# Patient Record
Sex: Male | Born: 1940 | Race: White | Hispanic: No | Marital: Married | Smoking: Former smoker
Health system: Southern US, Community
[De-identification: ages and names within clinical notes are randomized; demographics above are authoritative.]

## PROBLEM LIST (undated history)

## (undated) DIAGNOSIS — F0390 Unspecified dementia without behavioral disturbance: Secondary | ICD-10-CM

## (undated) DIAGNOSIS — K3184 Gastroparesis: Secondary | ICD-10-CM

## (undated) DIAGNOSIS — R6510 Systemic inflammatory response syndrome (SIRS) of non-infectious origin without acute organ dysfunction: Secondary | ICD-10-CM

## (undated) DIAGNOSIS — I471 Supraventricular tachycardia, unspecified: Secondary | ICD-10-CM

## (undated) DIAGNOSIS — K219 Gastro-esophageal reflux disease without esophagitis: Secondary | ICD-10-CM

## (undated) DIAGNOSIS — J8410 Pulmonary fibrosis, unspecified: Secondary | ICD-10-CM

## (undated) DIAGNOSIS — J449 Chronic obstructive pulmonary disease, unspecified: Secondary | ICD-10-CM

## (undated) DIAGNOSIS — R599 Enlarged lymph nodes, unspecified: Secondary | ICD-10-CM

## (undated) DIAGNOSIS — Z8701 Personal history of pneumonia (recurrent): Secondary | ICD-10-CM

## (undated) DIAGNOSIS — F419 Anxiety disorder, unspecified: Secondary | ICD-10-CM

## (undated) DIAGNOSIS — K602 Anal fissure, unspecified: Secondary | ICD-10-CM

## (undated) DIAGNOSIS — I714 Abdominal aortic aneurysm, without rupture: Secondary | ICD-10-CM

## (undated) DIAGNOSIS — Z720 Tobacco use: Secondary | ICD-10-CM

## (undated) DIAGNOSIS — Z860100 Personal history of colon polyps, unspecified: Secondary | ICD-10-CM

## (undated) DIAGNOSIS — R569 Unspecified convulsions: Secondary | ICD-10-CM

## (undated) DIAGNOSIS — K649 Unspecified hemorrhoids: Secondary | ICD-10-CM

## (undated) DIAGNOSIS — I2510 Atherosclerotic heart disease of native coronary artery without angina pectoris: Secondary | ICD-10-CM

## (undated) DIAGNOSIS — K5730 Diverticulosis of large intestine without perforation or abscess without bleeding: Secondary | ICD-10-CM

## (undated) DIAGNOSIS — K449 Diaphragmatic hernia without obstruction or gangrene: Secondary | ICD-10-CM

## (undated) DIAGNOSIS — I499 Cardiac arrhythmia, unspecified: Secondary | ICD-10-CM

## (undated) DIAGNOSIS — K279 Peptic ulcer, site unspecified, unspecified as acute or chronic, without hemorrhage or perforation: Secondary | ICD-10-CM

## (undated) DIAGNOSIS — K611 Rectal abscess: Secondary | ICD-10-CM

## (undated) DIAGNOSIS — R739 Hyperglycemia, unspecified: Secondary | ICD-10-CM

## (undated) DIAGNOSIS — E785 Hyperlipidemia, unspecified: Secondary | ICD-10-CM

## (undated) HISTORY — DX: Atherosclerotic heart disease of native coronary artery without angina pectoris: I25.10

## (undated) HISTORY — DX: Anal fissure, unspecified: K60.2

## (undated) HISTORY — DX: Diaphragmatic hernia without obstruction or gangrene: K44.9

## (undated) HISTORY — DX: Rectal abscess: K61.1

## (undated) HISTORY — DX: Cardiac arrhythmia, unspecified: I49.9

## (undated) HISTORY — DX: Supraventricular tachycardia, unspecified: I47.10

## (undated) HISTORY — DX: Gastro-esophageal reflux disease without esophagitis: K21.9

## (undated) HISTORY — DX: Personal history of colonic polyps: Z86.010

## (undated) HISTORY — DX: Unspecified convulsions: R56.9

## (undated) HISTORY — DX: Supraventricular tachycardia: I47.1

## (undated) HISTORY — DX: Diverticulosis of large intestine without perforation or abscess without bleeding: K57.30

## (undated) HISTORY — PX: TONSILLECTOMY: SUR1361

## (undated) HISTORY — DX: Personal history of pneumonia (recurrent): Z87.01

## (undated) HISTORY — DX: Hyperglycemia, unspecified: R73.9

## (undated) HISTORY — DX: Anxiety disorder, unspecified: F41.9

## (undated) HISTORY — DX: Tobacco use: Z72.0

## (undated) HISTORY — PX: APPENDECTOMY: SHX54

## (undated) HISTORY — DX: Personal history of colon polyps, unspecified: Z86.0100

## (undated) HISTORY — DX: Pulmonary fibrosis, unspecified: J84.10

## (undated) HISTORY — DX: Abdominal aortic aneurysm, without rupture: I71.4

## (undated) HISTORY — DX: Chronic obstructive pulmonary disease, unspecified: J44.9

## (undated) HISTORY — DX: Peptic ulcer, site unspecified, unspecified as acute or chronic, without hemorrhage or perforation: K27.9

## (undated) HISTORY — DX: Gastroparesis: K31.84

## (undated) HISTORY — DX: Hyperlipidemia, unspecified: E78.5

## (undated) HISTORY — DX: Unspecified hemorrhoids: K64.9

## (undated) HISTORY — DX: Enlarged lymph nodes, unspecified: R59.9

---

## 1997-03-24 HISTORY — PX: ABDOMINAL SURGERY: SHX537

## 1997-10-04 ENCOUNTER — Inpatient Hospital Stay (HOSPITAL_COMMUNITY): Admission: EM | Admit: 1997-10-04 | Discharge: 1997-10-13 | Payer: Self-pay | Admitting: Infectious Diseases

## 1999-01-14 ENCOUNTER — Other Ambulatory Visit: Admission: RE | Admit: 1999-01-14 | Discharge: 1999-01-14 | Payer: Self-pay | Admitting: Gastroenterology

## 1999-01-14 ENCOUNTER — Encounter (INDEPENDENT_AMBULATORY_CARE_PROVIDER_SITE_OTHER): Payer: Self-pay

## 1999-09-09 ENCOUNTER — Emergency Department (HOSPITAL_COMMUNITY): Admission: EM | Admit: 1999-09-09 | Discharge: 1999-09-09 | Payer: Self-pay | Admitting: Emergency Medicine

## 1999-11-07 ENCOUNTER — Inpatient Hospital Stay (HOSPITAL_COMMUNITY): Admission: EM | Admit: 1999-11-07 | Discharge: 1999-11-11 | Payer: Self-pay | Admitting: *Deleted

## 2002-12-15 ENCOUNTER — Encounter: Payer: Self-pay | Admitting: Family Medicine

## 2002-12-15 ENCOUNTER — Ambulatory Visit (HOSPITAL_COMMUNITY): Admission: RE | Admit: 2002-12-15 | Discharge: 2002-12-15 | Payer: Self-pay | Admitting: Family Medicine

## 2003-01-02 ENCOUNTER — Observation Stay (HOSPITAL_COMMUNITY): Admission: EM | Admit: 2003-01-02 | Discharge: 2003-01-03 | Payer: Self-pay | Admitting: Emergency Medicine

## 2003-01-02 ENCOUNTER — Encounter: Payer: Self-pay | Admitting: Emergency Medicine

## 2003-01-23 ENCOUNTER — Ambulatory Visit (HOSPITAL_COMMUNITY): Admission: RE | Admit: 2003-01-23 | Discharge: 2003-01-23 | Payer: Self-pay | Admitting: Family Medicine

## 2004-03-04 ENCOUNTER — Ambulatory Visit: Payer: Self-pay | Admitting: Family Medicine

## 2004-03-22 ENCOUNTER — Encounter: Admission: RE | Admit: 2004-03-22 | Discharge: 2004-03-22 | Payer: Self-pay | Admitting: Family Medicine

## 2004-04-22 ENCOUNTER — Ambulatory Visit: Payer: Self-pay | Admitting: Family Medicine

## 2004-09-09 ENCOUNTER — Ambulatory Visit: Payer: Self-pay | Admitting: Internal Medicine

## 2004-09-20 ENCOUNTER — Encounter: Admission: RE | Admit: 2004-09-20 | Discharge: 2004-09-20 | Payer: Self-pay | Admitting: Internal Medicine

## 2005-03-24 DIAGNOSIS — I714 Abdominal aortic aneurysm, without rupture, unspecified: Secondary | ICD-10-CM

## 2005-03-24 HISTORY — DX: Abdominal aortic aneurysm, without rupture, unspecified: I71.40

## 2005-03-24 HISTORY — DX: Abdominal aortic aneurysm, without rupture: I71.4

## 2005-04-06 ENCOUNTER — Emergency Department (HOSPITAL_COMMUNITY): Admission: EM | Admit: 2005-04-06 | Discharge: 2005-04-06 | Payer: Self-pay | Admitting: Family Medicine

## 2005-05-15 ENCOUNTER — Ambulatory Visit: Payer: Self-pay | Admitting: Internal Medicine

## 2005-09-04 ENCOUNTER — Ambulatory Visit: Payer: Self-pay | Admitting: Internal Medicine

## 2005-10-07 ENCOUNTER — Ambulatory Visit: Payer: Self-pay | Admitting: Internal Medicine

## 2005-10-17 ENCOUNTER — Ambulatory Visit: Payer: Self-pay | Admitting: Internal Medicine

## 2005-11-19 ENCOUNTER — Ambulatory Visit: Payer: Self-pay | Admitting: Internal Medicine

## 2006-05-18 ENCOUNTER — Ambulatory Visit: Payer: Self-pay | Admitting: Internal Medicine

## 2006-11-03 ENCOUNTER — Encounter (INDEPENDENT_AMBULATORY_CARE_PROVIDER_SITE_OTHER): Payer: Self-pay | Admitting: *Deleted

## 2007-01-01 ENCOUNTER — Telehealth (INDEPENDENT_AMBULATORY_CARE_PROVIDER_SITE_OTHER): Payer: Self-pay | Admitting: *Deleted

## 2007-02-25 ENCOUNTER — Ambulatory Visit: Payer: Self-pay | Admitting: Internal Medicine

## 2007-02-25 DIAGNOSIS — Z8711 Personal history of peptic ulcer disease: Secondary | ICD-10-CM

## 2007-02-25 DIAGNOSIS — F329 Major depressive disorder, single episode, unspecified: Secondary | ICD-10-CM

## 2007-02-25 DIAGNOSIS — I714 Abdominal aortic aneurysm, without rupture, unspecified: Secondary | ICD-10-CM | POA: Insufficient documentation

## 2007-02-25 DIAGNOSIS — Z8679 Personal history of other diseases of the circulatory system: Secondary | ICD-10-CM

## 2007-02-25 DIAGNOSIS — M949 Disorder of cartilage, unspecified: Secondary | ICD-10-CM

## 2007-02-25 DIAGNOSIS — K219 Gastro-esophageal reflux disease without esophagitis: Secondary | ICD-10-CM | POA: Insufficient documentation

## 2007-02-25 DIAGNOSIS — M899 Disorder of bone, unspecified: Secondary | ICD-10-CM | POA: Insufficient documentation

## 2007-02-25 DIAGNOSIS — E785 Hyperlipidemia, unspecified: Secondary | ICD-10-CM

## 2007-02-25 DIAGNOSIS — I2510 Atherosclerotic heart disease of native coronary artery without angina pectoris: Secondary | ICD-10-CM | POA: Insufficient documentation

## 2007-02-25 DIAGNOSIS — Z8719 Personal history of other diseases of the digestive system: Secondary | ICD-10-CM | POA: Insufficient documentation

## 2007-02-25 DIAGNOSIS — R7301 Impaired fasting glucose: Secondary | ICD-10-CM

## 2007-05-06 ENCOUNTER — Encounter: Payer: Self-pay | Admitting: Internal Medicine

## 2007-05-06 ENCOUNTER — Ambulatory Visit: Payer: Self-pay

## 2007-05-09 ENCOUNTER — Emergency Department (HOSPITAL_COMMUNITY): Admission: EM | Admit: 2007-05-09 | Discharge: 2007-05-09 | Payer: Self-pay | Admitting: Family Medicine

## 2007-05-10 ENCOUNTER — Telehealth (INDEPENDENT_AMBULATORY_CARE_PROVIDER_SITE_OTHER): Payer: Self-pay | Admitting: *Deleted

## 2007-05-11 ENCOUNTER — Ambulatory Visit: Payer: Self-pay | Admitting: Internal Medicine

## 2007-05-11 ENCOUNTER — Emergency Department (HOSPITAL_COMMUNITY): Admission: EM | Admit: 2007-05-11 | Discharge: 2007-05-11 | Payer: Self-pay | Admitting: Emergency Medicine

## 2007-05-11 ENCOUNTER — Ambulatory Visit: Payer: Self-pay | Admitting: Cardiology

## 2007-05-12 ENCOUNTER — Encounter: Payer: Self-pay | Admitting: Internal Medicine

## 2007-05-17 ENCOUNTER — Telehealth: Payer: Self-pay | Admitting: Internal Medicine

## 2007-06-18 ENCOUNTER — Encounter: Payer: Self-pay | Admitting: Internal Medicine

## 2008-02-07 ENCOUNTER — Encounter: Payer: Self-pay | Admitting: Internal Medicine

## 2008-04-24 ENCOUNTER — Telehealth: Payer: Self-pay | Admitting: Internal Medicine

## 2008-05-12 ENCOUNTER — Encounter: Payer: Self-pay | Admitting: Internal Medicine

## 2008-05-12 ENCOUNTER — Ambulatory Visit: Payer: Self-pay

## 2008-05-16 ENCOUNTER — Ambulatory Visit: Payer: Self-pay | Admitting: Family Medicine

## 2008-05-16 LAB — CONVERTED CEMR LAB
Basophils Absolute: 0.1 10*3/uL (ref 0.0–0.1)
Basophils Relative: 1 % (ref 0.0–3.0)
Eosinophils Absolute: 0.3 10*3/uL (ref 0.0–0.7)
Lymphocytes Relative: 25.4 % (ref 12.0–46.0)
Monocytes Absolute: 0.3 10*3/uL (ref 0.1–1.0)
Monocytes Relative: 3.6 % (ref 3.0–12.0)
Platelets: 210 10*3/uL (ref 150–400)
RBC: 5.45 M/uL (ref 4.22–5.81)
WBC: 9.2 10*3/uL (ref 4.5–10.5)

## 2008-05-23 ENCOUNTER — Telehealth (INDEPENDENT_AMBULATORY_CARE_PROVIDER_SITE_OTHER): Payer: Self-pay | Admitting: *Deleted

## 2008-05-25 ENCOUNTER — Ambulatory Visit: Payer: Self-pay | Admitting: Internal Medicine

## 2009-02-12 ENCOUNTER — Telehealth (INDEPENDENT_AMBULATORY_CARE_PROVIDER_SITE_OTHER): Payer: Self-pay | Admitting: *Deleted

## 2009-04-13 ENCOUNTER — Ambulatory Visit: Payer: Self-pay | Admitting: Internal Medicine

## 2009-04-13 ENCOUNTER — Telehealth (INDEPENDENT_AMBULATORY_CARE_PROVIDER_SITE_OTHER): Payer: Self-pay | Admitting: *Deleted

## 2009-04-13 DIAGNOSIS — R079 Chest pain, unspecified: Secondary | ICD-10-CM

## 2009-04-18 ENCOUNTER — Encounter (INDEPENDENT_AMBULATORY_CARE_PROVIDER_SITE_OTHER): Payer: Self-pay | Admitting: *Deleted

## 2009-04-18 ENCOUNTER — Telehealth (INDEPENDENT_AMBULATORY_CARE_PROVIDER_SITE_OTHER): Payer: Self-pay | Admitting: *Deleted

## 2009-04-19 ENCOUNTER — Encounter: Payer: Self-pay | Admitting: Internal Medicine

## 2009-05-18 ENCOUNTER — Telehealth (INDEPENDENT_AMBULATORY_CARE_PROVIDER_SITE_OTHER): Payer: Self-pay | Admitting: *Deleted

## 2009-06-18 ENCOUNTER — Telehealth (INDEPENDENT_AMBULATORY_CARE_PROVIDER_SITE_OTHER): Payer: Self-pay | Admitting: *Deleted

## 2009-07-18 LAB — CONVERTED CEMR LAB
BUN: 10 mg/dL
Chloride, Serum: 101 mmol/L
Creatinine, Ser: 0.8 mg/dL
Glucose, Bld: 109 mg/dL
Potassium, serum: 3.9 mmol/L
RBC count: 5.1 10*6/uL
TSH: 2.12 microintl units/mL
Total Bilirubin: 0.8 mg/dL
Triglyceride fasting, serum: 70 mg/dL
WBC, blood: 8.9 10*3/uL

## 2009-07-30 ENCOUNTER — Ambulatory Visit: Payer: Self-pay | Admitting: Internal Medicine

## 2009-08-03 ENCOUNTER — Telehealth (INDEPENDENT_AMBULATORY_CARE_PROVIDER_SITE_OTHER): Payer: Self-pay | Admitting: *Deleted

## 2010-04-21 LAB — CONVERTED CEMR LAB
AST: 30 units/L (ref 0–37)
AST: 32 units/L (ref 0–37)
BUN: 8 mg/dL (ref 6–23)
Basophils Absolute: 0 10*3/uL (ref 0.0–0.1)
CO2: 28 meq/L (ref 19–32)
Calcium: 9.3 mg/dL (ref 8.4–10.5)
Calcium: 9.3 mg/dL (ref 8.4–10.5)
Chloride: 106 meq/L (ref 96–112)
Creatinine, Ser: 0.7 mg/dL (ref 0.4–1.5)
Eosinophils Absolute: 0.2 10*3/uL (ref 0.0–0.6)
Eosinophils Absolute: 0.3 10*3/uL (ref 0.0–0.7)
Eosinophils Relative: 2.6 % (ref 0.0–5.0)
Eosinophils Relative: 3.2 % (ref 0.0–5.0)
GFR calc non Af Amer: 101.83 mL/min (ref >60)
HDL: 52.6 mg/dL (ref >39.0)
HDL: 64.7 mg/dL (ref >39.00)
Hemoglobin: 15.8 g/dL (ref 13.0–17.0)
Hemoglobin: 17.1 g/dL — ABNORMAL HIGH (ref 13.0–17.0)
Hgb A1c MFr Bld: 5.8 % (ref 4.6–6.0)
Hgb A1c MFr Bld: 5.9 % (ref 4.6–6.5)
Iron: 153 ug/dL (ref 42–165)
Lymphocytes Relative: 19.1 % (ref 12.0–46.0)
Lymphocytes Relative: 23.4 % (ref 12.0–46.0)
Lymphs Abs: 1.7 10*3/uL (ref 0.7–4.0)
Monocytes Absolute: 0.5 10*3/uL (ref 0.2–0.7)
Monocytes Absolute: 0.7 10*3/uL (ref 0.1–1.0)
Monocytes Relative: 7.4 % (ref 3.0–12.0)
Neutro Abs: 5 10*3/uL (ref 1.4–7.7)
PSA: 1.86 ng/mL (ref 0.10–4.00)
Platelets: 199 10*3/uL (ref 150.0–400.0)
RBC: 4.92 M/uL (ref 4.22–5.81)
RDW: 13.3 % (ref 11.5–14.6)
Total CHOL/HDL Ratio: 3
Total CHOL/HDL Ratio: 3.1
Triglycerides: 94 mg/dL (ref 0–149)
VLDL: 19 mg/dL (ref 0–40)
WBC: 7.4 10*3/uL (ref 4.5–10.5)

## 2010-04-25 NOTE — Progress Notes (Signed)
  Phone Note Refill Request Message from:  Pharmacy on [facility] on [address]. Fax #: [phone]  Refills Requested: Medication #1:  VERAPAMIL HCL CR 180 MG CP24 1 by mouth qd   Dosage confirmed as above?Dosage Confirmed   Supply Requested: 1 month The 180 ER capsules are on long term backorder.Marland KitchenMarland KitchenCan we switch this patient to the 180 ER TABLETS??? Thank you.  Initial call taken by: Harold Barban,  May 18, 2009 2:08 PM  Follow-up for Phone Call        please clarify above [place] E. Paz MD  May 18, 2009 5:09 PM   -Spoke with pharmacist  who says they do have capsules in and rx is ready.  disregard;  rx is in stock Kandice Hams  May 21, 2009 11:04 AM  Follow-up by: Kandice Hams,  May 21, 2009 11:04 AM

## 2010-04-25 NOTE — Progress Notes (Signed)
Summary: Phone//Pantoprazole too expensive//Paz see   Phone Note Call from Patient Call back at Home Phone [phone]   Caller: Patient Summary of Call: patient is requesting a call back about medications he stated the med for his stomach is too expensive. Patient would like something else prescribe. Patient also has bloodwork today and would like to have his psa checked with the bloodwork he had today. Initial call taken by: Barb Merino,  April 13, 2009 10:55 AM  Follow-up for Phone Call        Pt says med Pantoprazole too expensive cost him $299 for 30 pills wants to get something else.pt also informed PSA not able to be done today (per Rene Kocher), will do PSA at [facility]. Kandice Hams  April 13, 2009 11:40 AM  Follow-up by: Kandice Hams,  April 13, 2009 11:40 AM  Additional Follow-up for Phone Call Additional follow up Details #1::        omeprazole 40mg  1 by mouth two times a day #60, no RF if unable to get insurance to cover , he may just buy OTC omeprazole Jose E. Paz MD  April 13, 2009 1:23 PM     Additional Follow-up for Phone Call Additional follow up Details #2::    Rx changed, pt informed .Kandice Hams  April 13, 2009 2:08 PM  Follow-up by: Kandice Hams,  April 13, 2009 2:08 PM  New/Updated Medications: OMEPRAZOLE 40 MG CPDR (OMEPRAZOLE) 1 by mouth two times a day Prescriptions: OMEPRAZOLE 40 MG CPDR (OMEPRAZOLE) 1 by mouth two times a day  #60 x 0   Entered by:   Kandice Hams   Authorized by:   Nolon Rod. Paz MD   Signed by:   Kandice Hams on 04/13/2009   Method used:   Faxed to ...       [facility] [address] [phone]* (retail)       [address]       [place], [place]  [address]       Ph: [phone]       Fax: [phone]   RxID:   [id number]

## 2010-04-25 NOTE — Assessment & Plan Note (Signed)
Summary: yearly  /kdc   Vital Signs:  Patient profile:   70 year old male Height:      70.5 inches Weight:      193.4 pounds BMI:     27.46 Pulse rate:   86 / minute BP sitting:   122 / 74  Vitals Entered By: Shary Decamp (Jul 30, 2009 1:57 PM) CC: yearly, fasting   History of Present Illness: was seen at the [place] for a check up few days ago: brought records from the [place] for my review: 07/18/09 WBC 8.9, hemoglobin 15.6, platelets 213, urinalysis negative potassium 3.9, creatinine 0.8, calcium 8.9, LFTs normal LDL 74, HDL 69, total cholesterol 478, triglycerides 70  Anxiety-- started fluoxetine 9 days ago at the [place] , was seen by psych   Coronary artery disease had a episode of SOB, to have a stress test at the [place] soon  GERD-- self d/c omeprazole d/t nausea   AAA--had to cancel d/t an acute ilness, plans to reschedule   yearly checkup, chart reviewed     Current Medications (verified): 1)  Verapamil Hcl Cr 180 Mg Cp24 (Verapamil Hcl) .Marland Kitchen.. 1 By Mouth Once Daily - Needs Physical For Additional Refills 2)  Bayer Low Strength 81 Mg  Tbec (Aspirin) 3)  Nitroquick 0.4 Mg  Subl (Nitroglycerin) .Marland Kitchen.. 1 Q 5 Min As Needed Chest Pain 4)  Fluoxetine Hcl 10 Mg Tabs (Fluoxetine Hcl) .... 2 By Mouth Qam 5)  Zocor 40 Mg Tabs (Simvastatin) .Marland Kitchen.. 1 By Mouth Once Daily  Allergies (verified): 1)  ! Codeine 2)  ! Flagyl  Past History:  Past Medical History: Reviewed history from 04/13/2009 and no changes required. Anxiety Coronary artery disease per cath, no intervention needed  Diverticulitis, hx of GERD SVT AAA glucose impaired Hyperlipidemia  Past Surgical History: Reviewed history from 04/13/2009 and no changes required. 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 Cath 2004 for CP:  "The patient has total occlusion of the  dominant circumflex which is stable compared with August 2001.  There is no change in his coronary anatomy to explain the change in his symptoms.  Will add nitrates for control of his angina"  Family History: Prostate Ca - DAD colon ca--no DM - M MI-- no  Social History: Married 4 children tobacco--  > 1PPD, to get counseling at the [place] soon  ETOH-- "almost daily, working on cutting down"-- to get counseling at the [place] soon still very active   Review of Systems CV:  Denies chest pain or discomfort and swelling of feet. Resp:  Denies coughing up blood and wheezing; occasionally cough, in AM , yellow sputum . GI:  Denies bloody stools, diarrhea, nausea, and vomiting. GU:  Denies dysuria and hematuria.  Physical Exam  General:  alert, well-developed, and well-nourished.   Neck:  normal carotid upstroke.   Lungs:  normal respiratory effort, no intercostal retractions, no accessory muscle use, and normal breath sounds.   Heart:  normal rate and regular rhythm.   Abdomen:  soft, non-tender, no distention, and no masses.   Rectal:  done at the [place] few days ago, reportedly normal Extremities:  no pretibial edema bilaterally  Psych:  Oriented X3, memory intact for recent and remote, normally interactive, good eye contact, not anxious appearing, and not depressed appearing.     Impression & Recommendations:  Problem # 1:  HYPERLIPIDEMIA (  ICD-272.4) 4/27/11labs  LFTs normal LDL 74, HDL 69, total cholesterol 027, triglycerides 70 at goal , no change His updated medication list for this problem includes:    Zocor 40 Mg Tabs (Simvastatin) .Marland Kitchen... 1 by mouth once daily  Problem # 2:  HEALTH SCREENING (ICD-V70.0) Td 03  pneumonia shot:  1-11  recent PSA normal reports he will have a colonoscopy at the [place] soon he will have counseling at the [place] in reference to tobacco and alcohol abuse, per patient    Problem # 3:  IMPAIRED FASTING GLUCOSE (ICD-790.21) 07/18/09 CBG was 109 A1C 1-11  5.9 RTC 6 months   Problem # 4:  AAA (ICD-441.4)  was due for ultrasound to  2/11, states he will reschedule  Problem # 5:  CORONARY ARTERY DISEASE (ICD-414.00) due to a recent problem with shortness of breath, he will have a stress test at the [place]  His updated medication list for this problem includes:    Verapamil Hcl Cr 180 Mg Cp24 (Verapamil hcl) .Marland Kitchen... 1 by mouth once daily - needs physical for additional refills    Bayer Low Strength 81 Mg Tbec (Aspirin)    Nitroquick 0.4 Mg Subl (Nitroglycerin) .Marland Kitchen... 1 q 5 min as needed chest pain  Problem # 6:  ANXIETY (ICD-300.00) recently seen at the [place] w/  anxiety, no depression. he was also seen by psychiatry's, started fluoxetine, to have counseling soon  His updated medication list for this problem includes:    Fluoxetine Hcl 10 Mg Tabs (Fluoxetine hcl) .Marland Kitchen... 2 by mouth qam  Complete Medication List: 1)  Verapamil Hcl Cr 180 Mg Cp24 (Verapamil hcl) .Marland Kitchen.. 1 by mouth once daily - needs physical for additional refills 2)  Bayer Low Strength 81 Mg Tbec (Aspirin) 3)  Nitroquick 0.4 Mg Subl (Nitroglycerin) .Marland Kitchen.. 1 q 5 min as needed chest pain 4)  Fluoxetine Hcl 10 Mg Tabs (Fluoxetine hcl) .... 2 by mouth qam 5)  Zocor 40 Mg Tabs (Simvastatin) .Marland Kitchen.. 1 by mouth once daily  Patient Instructions: 1)  Please schedule a follow-up appointment in 6 months  Prescriptions: VERAPAMIL HCL CR 180 MG CP24 (VERAPAMIL HCL) 1 by mouth once daily - NEEDS PHYSICAL FOR ADDITIONAL REFILLS  #30 x 12   Entered and Authorized by:   Nolon Rod. Avo Schlachter MD   Signed by:   Nolon Rod. Paige Monarrez MD on 07/30/2009   Method used:   Print then Give to Patient   RxID:   [id number]    Preventive Care Screening  Prior Values:    PSA:  1.86 (04/13/2009)    Colonoscopy:  polyps - recheck in 3 years (02/18/2003)    Last Tetanus Booster:  historical (07/23/2001)    Last Pneumovax:  Pneumovax (Medicare) (04/13/2009)    Complete Blood Count Test Date: 07/18/2009             Value    Units      H/L    Reference  WBC:       8.9   X 10^3/uL          (3.5-10.0) RBC:       5.10  X 10^6/uL          (4.20-5.80) Hgb:       15.6  g/dl               (63.8-75.6) Hct:       48.0  %                  (  38.5-52.0) Platelets: 213   X 10^3/uL          (150-450)    Lab Entry Test Date: 07/18/2009                        Value        Units        H/L   Reference  PSA:                  1.37         ng/ml              (0-4.0) TSH:                  2.12         mIU/ml             (0.5-5.0)    Lipid Panel Test Date: 07/18/2009                        Value        Units        H/L   Reference  Cholesterol:          157          mg/dL              (045-409) LDL Cholesterol:      74           mg/dL              (81-191) HDL Cholesterol:      69           mg/dL              (47-82) Triglyceride:         70           mg/dL              (95-621) SGOT (AST)            16                              SGPT (ALT)            36                                 Chemistry Labs Test Date: 07/18/2009                      Value Units        H/L   Reference  Sodium:             138   mmol/L             (137-145) Potassium:          3.9   mmol/L             (3.6-5.0) Chloride:           101   mmol/L             (101-111) CO2:                29    mmol/L             (22-31) BUN:  10    mg/dL              (1-61) Creatinine:         0.8   mg/dL              (0.9-6.0) Glucose-random:     109   mg/dL              (45-409) Calcium (total):    8.9   mg/dL         L    (8-11.9) Alkaline P'tase:    73    U/L                (10-120) T. Bili:            0.8   mg/dL              (1.4-7.8) Albumin:            4.2   g/dL               (3-5) Total Protein:      8.0   g/dL          H    (4-7)

## 2010-04-25 NOTE — Assessment & Plan Note (Signed)
Summary: due ov/kdc   Vital Signs:  Patient profile:   70 year old male Weight:      195.25 pounds Pulse rate:   64 / minute BP sitting:   140 / 80  Vitals Entered By: Kandice Hams (April 13, 2009 8:26 AM) CC: med refill   History of Present Illness: last ROV  a while back, goes to the [place] sometimes  Chief complaint today is chest pain, sx going on for more than a year, getting more frequently in the last few months, now almost daily Pain is usually at rest, in the anterior  chest, without radiation. CP goes away "as soon as I  belch"  no associated nausea, diaphoresis has never tried nitroglycerin for that walks the dog  twice a day without chest pain  AAA--last ultrasound stable, February 2010.   Hyperlipidemia-- off zocor x aprox a year, [facility]"  history of colon polyps, due for colonoscopy  Allergies: 1)  ! Codeine 2)  ! 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 Cath 2004 for CP:  "The patient has total occlusion of the dominant circumflex which is stable compared with August 2001.  There is no change in his coronary anatomy to explain the change in his symptoms.  Will add nitrates for control of his angina"  Social History: Married 4 children tobacco--  > 1PPD ETOH-- "almost daily, working on cutting down"  Review of Systems General:  Denies fever. Resp:  Denies coughing up blood; chronic mild cough x years. GI:  occ has classic heartburn no dysphagia or odynophagia.  Physical Exam  General:  alert and well-developed.   Lungs:  Normal respiratory effort, chest  expands symmetrically. Lungs are clear to auscultation, no crackles or wheezes. Heart:  normal rate and regular rhythm.   Abdomen:  soft, non-tender, no distention, no masses, no guarding, and no rigidity.   Extremities:  no edema   Impression & Recommendations:  Problem # 1:  CHEST PAIN (ICD-786.50) Assessment New quite atypical chest pain, will start PPIs and reasses on RTC ER  if symptoms increase  Orders: TLB-ALT (SGPT) (84460-ALT) TLB-AST (SGOT) (84450-SGOT) TLB-CBC Platelet - w/Differential (85025-CBCD) TLB-Lipid Panel (80061-LIPID) EKG w/ Interpretation (93000)  Problem # 2:  GERD (ICD-530.81) history of GERD and PUD start PPIs  His updated medication list for this problem includes:    Omeprazole 40 Mg Cpdr (Omeprazole) .Marland Kitchen... 1 by mouth two times a day  Problem # 3:  HYPERLIPIDEMIA (ICD-272.4) off medication see HPI ---->  explained  that in his case he likely will need medication for life The following medications were removed from the medication list:    Zocor 40 Mg Tabs (Simvastatin) ..... 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  See past surgical history His updated medication list for this problem includes:    Verapamil Hcl Cr 180 Mg Cp24 (Verapamil hcl) .Marland Kitchen... 1 by mouth qd    Bayer Low Strength 81 Mg Tbec (Aspirin)    Nitroquick 0.4 Mg Subl (Nitroglycerin) .Marland Kitchen... 1 q 5 min as needed chest  pain  Orders: TLB-BMP (Basic Metabolic Panel-BMET) (80048-METABOL)  Problem # 7:  HEALTH SCREENING (ICD-V70.0)  due for a physical exam,  will come back in two weeks strongly recommended flu shot, explained benefits, declined  requested a pneumonia shot: done  counseling strongly against tobacco  Complete Medication List: 1)  Verapamil Hcl Cr 180 Mg Cp24 (Verapamil hcl) .Marland Kitchen.. 1 by mouth qd 2)  Bayer Low Strength 81 Mg Tbec (Aspirin) 3)  Nitroquick 0.4 Mg Subl (Nitroglycerin) .Marland Kitchen.. 1 q 5 min as needed chest pain 4)  Hydrocodone-acetaminophen 10-325 Mg/16ml Soln (Hydrocodone-acetaminophen) .Marland Kitchen.. 1 by mouth every 6 hours as needed 5)  Hydrocortisone Supp.  .... Use two times a day 6)  Buspirone Hcl 5 Mg Tabs (Buspirone hcl) .... 1/2 by mouth bid 7)  Omeprazole 40 Mg Cpdr (Omeprazole) .Marland Kitchen.. 1 by mouth two times a day  Other Orders: Venipuncture (04540) Pneumococcal Vaccine (98119) Admin 1st Vaccine (14782) Admin 1st Vaccine [facility]) [phone])  Patient Instructions: 1)  start Protonix twice a day 2)  go to the ER if  severe chest pain or last more than 5 minutes. take nitroglycerin 3)  come back in two weeks for a physical exam Prescriptions: NITROQUICK 0.4 MG  SUBL (NITROGLYCERIN) 1 q 5 min as needed Chest pain  #20 x 0   Entered and Authorized by:   Nolon Rod. Chyrel Taha MD   Signed by:   Nolon Rod. Aedon Deason MD on 04/13/2009   Method used:   Electronically to        [facility]. [phone]* (retail)       [address]       [place], [place]  [address]       Ph: [phone]       Fax: [phone]   RxID:   [id number] VERAPAMIL HCL CR 180 MG CP24 (VERAPAMIL HCL) 1 by mouth qd  #30.0 Each x 6   Entered and Authorized by:   Nolon Rod. Ayven Pheasant MD   Signed by:   Nolon Rod. Bonny Vanleeuwen MD on 04/13/2009   Method used:   Electronically to        [facility]. [phone]* (retail)       [address]       [place], [place]  [address]       Ph: [phone]       Fax: [phone]   RxID:    [id number] PANTOPRAZOLE SODIUM 40 MG TBEC (PANTOPRAZOLE SODIUM) one twice a day on an empty stomach  #60 x 3   Entered and Authorized by:   Nolon Rod. Seferino Oscar MD   Signed by:   Nolon Rod. Naileah Karg MD on 04/13/2009   Method used:   Electronically to        [facility]. [phone]* (retail)       [address]       [place], [place]  [address]       Ph: [phone]       Fax: [phone]   RxID:   [id number]    Pneumovax Vaccine    Vaccine Type: Pneumovax (Medicare)  Site: left deltoid    Dose: 0.5 ml    Route: IM    Given by: Shary Decamp    Exp. Date: 04/19/2010    Lot #: [id number]

## 2010-04-25 NOTE — Progress Notes (Signed)
   Release of Information Sent to Pt Via Mail today  Dennis Combs  Aug 03, 2009 10:44 AM    Appended Document:  Recieved ROIback from pt, copied records and mailed out to pt today

## 2010-04-25 NOTE — Letter (Signed)
Summary: [facility]   Imported By: Lanelle Bal 02/24/2008 12:24:46  _____________________________________________________________________  External Attachment:    Type:   Image     Comment:   External Document

## 2010-04-25 NOTE — Medication Information (Signed)
Summary: Prior Authorization & Approval for Omeprazole/[employer]  Prior Authorization & Approval for Omeprazole/[employer]   Imported By: Lanelle Bal 04/25/2009 09:42:53  _____________________________________________________________________  External Attachment:    Type:   Image     Comment:   External Document

## 2010-04-25 NOTE — Letter (Signed)
Summary: Results Follow up Letter  [facility] [place]  [address] [place], [place] [address]   Phone: [phone]  Fax: [phone]    04/18/2009 MRN: [id number]  Dennis Combs [address] [place], [place]  [address]  Dear [name],  The following are the results of your recent test(s):  Test         Result    Pap Smear:        Normal _____  Not Normal _____ Comments: ______________________________________________________ Cholesterol: LDL(Bad cholesterol):         Your goal is less than:         HDL (Good cholesterol):       Your goal is more than: Comments:  ______________________________________________________ Mammogram:        Normal _____  Not Normal _____ Comments:  ___________________________________________________________________ Hemoccult:        Normal _____  Not normal _______ Comments:    _____________________________________________________________________ Other Tests:    We routinely do not discuss normal results over the telephone.  If you desire a copy of the results, or you have any questions about this information we can discuss them at your next office visit.   Sincerely,

## 2010-04-25 NOTE — Progress Notes (Signed)
Summary: Prior Auth APPROVED FOR [facility]  Phone Note From Pharmacy   Caller: [facility] [place]. [phone]* Summary of Call: Omeprazole require Prior [facility] [phone].  Spoke with pt given Dr Drue Novel remcommendations if ins does not pay can use OTC Omeprazole, pt wants to proceeds with Prior auth to see what ins says.   Prior auth in process with [employer] .Kandice Hams  April 18, 2009 12:18 PM   Follow-up for Phone Call        prior Auth approved for Omeprazole until 03/23/2010. [facility] FAXED, PT WIFE INFORMED Follow-up by: Kandice Hams,  April 19, 2009 3:55 PM

## 2010-04-25 NOTE — Progress Notes (Signed)
Summary: refill  Phone Note Refill Request Message from:  Fax from Pharmacy on June 18, 2009 2:56 PM  Refills Requested: Medication #1:  VERAPAMIL HCL CR 180 MG CP24 1 by mouth qd [facility] [address] fax [phone]   Method Requested: Fax to Local Pharmacy Next Appointment Scheduled: no appt  Initial call taken by: Barb Merino,  June 18, 2009 2:58 PM    New/Updated Medications: VERAPAMIL HCL CR 180 MG CP24 (VERAPAMIL HCL) 1 by mouth once daily - NEEDS PHYSICAL FOR ADDITIONAL REFILLS Prescriptions: VERAPAMIL HCL CR 180 MG CP24 (VERAPAMIL HCL) 1 by mouth once daily - NEEDS PHYSICAL FOR ADDITIONAL REFILLS  #30 x 0   Entered by:   Shary Decamp   Authorized by:   Nolon Rod. Paz MD   Signed by:   Shary Decamp on 06/18/2009   Method used:   Electronically to        [facility]. [phone]* (retail)       [address]       [place], [place]  [address]       Ph: [phone]       Fax: [phone]   RxID:   [id number]

## 2010-04-25 NOTE — Progress Notes (Signed)
Summary: [facility]px  Phone Note Outgoing Call   Summary of Call: NEEDS CPX Dennis Combs  June 18, 2009 4:55 PM     Additional Follow-up for Phone Call Additional follow up Details #2::    patient has an appt on May 9,2011 Follow-up by: Barb Merino,  June 18, 2009 5:00 PM

## 2010-08-06 NOTE — Consult Note (Signed)
NAME:  Dennis Combs, Dennis Combs NO.:  [id number]   MEDICAL RECORD NO.:  [id number]          PATIENT TYPE:  EMS   LOCATION:  ED                           FACILITY:  [facility]   PHYSICIAN:  Thornton Park. Daphine Deutscher, MD  DATE OF BIRTH:  [birth year]   DATE OF CONSULTATION:  05/11/2007  DATE OF DISCHARGE:                                 CONSULTATION   ER PROCEDURE AND CONSULTATION:  Dennis Combs is a 70 year old white male that I met in room #1 in the  [facility] ED.  He was sent over from the [place] CT scanner on [facility], where a CT scan was told to have shown a perirectal abscess.  On  examination, lying in the left lateral decubitus position, he had a  tender area that seemed to be more inside his anus, buttock on the right  side.  I prepped him in the ER with Betadine and numbed up an area over  that region.  I then took an 18-gauge needle and went into that region  and encountered pus.  I then cut around that and about a 2-cm hole and  out of that drained copious amounts of pus.  The patient is been on  antibiotics including Augmentin and had something for pain.  He was  markedly relieved of pain when I drained this.  He has an appointment to  follow up in our office tomorrow to see Dr. Zachery Dakins in the afternoon.  I told him to continue taking his antibiotic, use the pain pills and sit  in a tub of warm water for pain relief.  This abscess apparently has  been growing for several days now and was gradually trying to point to  his perianal region.  It was a deep perirectal abscess and it has been  incised and drained, but it will need to be followed up in our office.      Thornton Park Daphine Deutscher, MD  Electronically Signed     MBM/MEDQ  D:  05/11/2007  T:  05/12/2007  Job:  [id number]   cc:   Willow Ora, MD  [phone] [address] [place], [place] [address]

## 2010-08-09 NOTE — Cardiovascular Report (Signed)
NAME:  [name], Dennis Combs                         ACCOUNT NO.:  [id number]   MEDICAL RECORD NO.:  [id number]                   PATIENT TYPE:  INP   LOCATION:  2013                                 FACILITY:  [facility]   PHYSICIAN:  Salvadore Farber, M.D.             DATE OF BIRTH:  1940/11/08   DATE OF PROCEDURE:  01/02/2003  DATE OF DISCHARGE:  01/03/2003                              CARDIAC CATHETERIZATION   PROCEDURE:  Left heart catheterization, left ventriculography, coronary  angiography.   INDICATIONS:  [name] is a 70 year old gentleman who presents with  increased frequency of angina.  At cardiac catheterization in August 2001,  he was found to have total occlusion of dominant circumflex with the distal  vasculature collateralized.  Over the past several weeks he has had angina  occurring with less exertion than prior.  An EGD Cardiolite performed  December 26, 2002, demonstrated EF 55% with predominantly fixed inferior  defects with very mild peri-infarct reversibility.  He subsequently  presented to the emergency room with complaints of increasing frequency of  angina.  He was referred for diagnostic angiography.   PROCEDURAL TECHNIQUE:  Informed consent was obtained.  Under 1% lidocaine  local anesthesia, a 6-French sheath was placed in the right femoral artery  using the modified Seldinger technique.  Diagnostic angiography and  ventriculography were performed JL4, JR4, and pigtail catheters.  The  arteriotomy site was then closed using a 6-French Angio-Seal Device.  Complete hemostasis was obtained.  The patient tolerated the procedure well  and was transferred to the holding room in stable condition.  Sheaths are to  be removed there.   COMPLICATIONS:  None.   FINDINGS:  1. LV:  138/6/10.  EF appears normal though calculation is impaired by     ventricular ectopy during ventriculography.  2. No aortic stenosis on pullback.  Unable to assess mitral regurgitation  due to ectopy.  3. Left main:  Angiographically normal.  4. LAD:  The LAD is a moderate-sized vessel giving rise to two moderate-     sized diagonal branches.  The first diagonal has a 50% ostial stenosis.  5. Circumflex:  Large, dominant vessel.  The vessel gives rise to two small     proximal obtuse marginals then two larger obtuse marginals arising from     the midportion of the vessel.  The vessel is then occluded in the distal     portion of the AV groove.  The length of the total occlusion is     approximately 15 mm.  The left PDA is then collateralized from both the     circumflex and then RCA.  6. RCA:  The RCA is a moderate-sized, nondominant vessel.  It is     angiographically normal.    IMPRESSION/RECOMMENDATIONS:  The patient has total occlusion of the dominant  circumflex which is stable compared with August 2001.  There is no change  in  his coronary anatomy to explain the change in his symptoms.  Will add  nitrates for control of his angina.  Will discharge home tonight after  hydration.                                               Salvadore Farber, M.D.    WED/MEDQ  D:  01/03/2003  T:  01/03/2003  Job:  [id number]   cc:   Angelena Sole, M.D. [facility]   Duke Salvia, M.D.

## 2010-08-09 NOTE — Discharge Summary (Signed)
. [facility]  Patient:    Dennis Combs, Dennis Combs                        MRN: [id number] Adm. Date:  11/07/99 Disc. Date: 11/11/99 Attending:  Noralyn Pick. Eden Emms, M.D. [facility] Dictator:   Rozell Searing, P.A. CC:         Angelena Sole, M.D. [facility]             Vania Rea. Jarold Motto, M.D. [facility]                  Referring Physician Discharge [place]  PROCEDURES:  Cardiac catheterization November 11, 1999.  REASON FOR ADMISSION:  Mr. Coolman is a 70 year old male with multiple cardiac risk factors and history of SVT - followed by Dr. Odessa Fleming, who presented with atypical/typical features of chest pain.  LABORATORY DATA:  Serial cardiac enzymes negative for MI.  CBC normal. Metabolic profile normal, save for elevated glucose of 167.  Lipid profile: Cholesterol 213, triglyceride 133, HDL 38, LDL 148, cholesterol/HDL ratio 5.6. Negative occult blood.  Admission chest x-ray:  Scattered calcified nodular densities in both lungs, probably the result of remote granulomatous disease; possible histoplasmosis. Bibasilar scarring versus atelectatic change.  HOSPITAL COURSE:  Following admission, the patient ruled out for myocardial infarction with negative serial cardiac enzymes.  Aspirin was added to the home medication regimen and, given the multiple cardiac risk factors, notable for tobacco smoking and hypercholesterolemia, recommendation was to proceed with definitive coronary angiography to exclude ischemic heart disease as the source of the recurrent pain.  Of note, Dr. Graciela Husbands did point out that, given some of the atypical features of the presentation, recommendation would be for patient to follow-up with Dr. Sheryn Bison for GI evaluation following the discharge.  Cardiac catheterization, performed on August 20 by Dr. Daiva Nakayama (see catheterization report for full details), revealed single-vessel CAD with 100% mid CFX with bridging collaterals and diffuse distal disease.  Left  ventricle was within normal limits with preserved ejection fraction (EF 65%) and normal wall motion.  Dr. Antoine Poche concluded that the lesion was not very amenable to percutaneous intervention and would give low yield for long-term success.  Therefore, he recommended medical therapy.  Of note, consideration was given to discontinuation of verapamil and initiation of Toprol 50 mg q.d.  However, in reviewing this with the patient, it was learned that he has a prior history of BETA BLOCKER INTOLERANCE, citing both low blood pressure and heart rate.  He points out that he has done quite well on verapamil.  We will thus defer any discussion of beta blocker, to follow-up with Dr. Odessa Fleming.  Given the lipid profile revealing LDL 148, patient was started on Zocor 20 mg q.d.  He will need follow-up LFT/lipid profile in three months.  MEDICATIONS AT DISCHARGE: 1. Calan SR 180 mg q.d. 2. Zocor 20 mg q.h.s. 3. Prevacid 15 mg q.d. 4. Aspirin 81 mg q.d. 5. Nitrostat as directed.  INSTRUCTIONS:  ACTIVITY:  Patient is to refrain from any strenuous activity/driving x 2 days.  DIET:  Maintain low fat/cholesterol diet.  WOUND CARE:  Call our office if there is any swelling/bleeding of the groin.  FOLLOW-UP:  Patient will follow up with Dr. Kathie Rhodes. [facility] on Tuesday, November 26, 1999 at 11 a.m.  Patient is instructed to schedule a follow-up appointment with Dr. Sheryn Bison in the ensuing few weeks for GI evaluation.  DISCHARGE DIAGNOSES: 1.  Single-vessel coronary artery disease.    a. Mid circumflex 100% with bridging collaterals - cardiac       catheterization November 11, 1999.    b. Normal left ventricular function. 2. History of supraventricular tachycardia - controlled on verapamil. 3. History of BETA BLOCKER INTOLERANCE  - lethargy/bradycardia. 4. Dislipidemia - HMG-CoA reductase inhibitor initiated. 5. Tobacco. 6. History of peptic ulcer disease. 7. 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 DD:  11/11/99 TD:  11/11/99 Job: [id number]

## 2010-08-09 NOTE — H&P (Signed)
NAME:  RIKI, Dennis Combs NO.:  [id number]   MEDICAL RECORD NO.:  [id number]                   PATIENT TYPE:  INP   LOCATION:  2013                                 FACILITY:  [facility]   PHYSICIAN:  Willa Rough, M.D.                  DATE OF BIRTH:  March 31, 1940   DATE OF ADMISSION:  01/02/2003  DATE OF DISCHARGE:                                HISTORY & PHYSICAL   HISTORY OF PRESENT ILLNESS:  Mr. Dauphine is a 70 year old male, with known  coronary artery disease, who presents to the emergency room with  progressive, nonexertional chest pain and dyspnea over the past six weeks.   Cardiac history notable for remote previous cardiac catheterization in  August 2001, revealing 100% occlusion of the circumflex with bridging  collaterals with otherwise normal LAD and RCA and normal left ventricular  function.  He reports having had a recent exercise stress test in our office  which was abnormal (results currently pending).   Although patient reports increased frequency of these episodes of the chest  pain over the past several weeks, he denies any relationship with exertion.  He walks on a regular basis and, in fact, reported walking two miles prior  to his stress test with no complaints of chest discomfort.  He also notes no  symptoms of orthopnea, paroxysmal exertional dyspnea, or pedal edema.   Electrocardiogram in the emergency room is benign, revealing NSR with no  acute changes.   Chest x-ray shows stable bilateral granulomata with no acute process.  Initial cardiac markers are normal.   ALLERGIES:  1. CODEINE  2. BETA BLOCKER intolerance.   PAST MEDICAL HISTORY:  1. Supraventricular tachycardia - treated with verapamil.  2. Dyslipidemia.  3. History of peptic ulcer disease - status post surgery July 1999.  4. History of granulomatous lung disease (question histoplasmosis).  5. Remote appendectomy.  6. Hiatal hernia.   SOCIAL HISTORY:  Lives in  [place] with his wife.  Has been smoking 1-2  packs per day for the past 40 some odd years.  Drinks alcohol on a moderate  basis.  Walks regularly.   FAMILY HISTORY:  Negative for coronary artery disease.   REVIEW OF SYSTEMS:  Exertional dyspnea with no orthopnea, PND, or pedal  edema.  Denies exertional chest pain.  Has heartburn symptoms.  Denies any  recent obvious upper or lower gastrointestinal bleeding.  Remaining systems  negative.   LABORATORY DATA:  Hemoglobin 15.9, hematocrit 47.3, WBC .3, platelets 213.  Sodium 140, potassium 3.9, BUN 9, creatinine 0.9, glucose 86.  Elevated  total bilirubin 1.3, indirect bilirubin 1.2 with normal direct bilirubin.  Remaining liver enzymes normal.  POC enzymes less than 1.0 MB and less than  0.05 troponin.  INR 0.9.   PHYSICAL EXAMINATION:  VITAL SIGNS:  Blood pressure 140/75, pulse 83,  respirations 24, temperature 98.4, CO2  98% (room air).  GENERAL:  A 70 year old male in no apparent distress.  HEENT:  Normocephalic, atraumatic.  NECK:  Adequate, carotid pulses without bruits.  LUNGS:  Clear to auscultation in all fields.  HEART:  Regular rate and rhythm (S1, S2).  ABDOMEN:  Soft, nontender.  Intact bowel sounds without bruits.  EXTREMITIES:  Preserved distal pulses with no significant pedal edema.  NEUROLOGIC:  Alert and oriented.   IMPRESSION:  A 70 year old male, with known coronary artery disease,  presents with increased frequency of chest pain and dyspnea over the past  several weeks.  His previous coronary anatomy notable for total occlusion of  the circumflex with bridging of collaterals and normal left ventricular  function, right coronary angiogram in August 2001.  The patient has multiple  cardiac risk factors, including dyslipidemia and continues tobacco smoking.  The patient also presents with mildly elevated bilirubin levels with  otherwise normal liver enzymes and history of peptic ulcer disease.   PLAN:  Admit  for rule out of myocardial infarction and recommendations to  proceed with diagnostic coronary angiography and possible percutaneous  intervention.  The patient is agreeable with this plan and is willing to  proceed.  He will be placed on intravenous heparin, and serial cardiac  markers will be drawn.  We will also check a fasting lipid profile and a  repeat hepatic profile in the a.m.  The patient will be continued on  verapamil for his history of supraventricular tachycardia and as noted,  intolerance to BETA BLOCKERS.      Gene Serpe, P.A. [facility]                      Willa Rough, M.D.    GS/MEDQ  D:  01/02/2003  T:  01/02/2003  Job:  [id number]   cc:   Angelena Sole, M.D. [facility]

## 2010-08-09 NOTE — Discharge Summary (Signed)
NAME:  Dennis Combs, Dennis Combs                         ACCOUNT NO.:  [id number]   MEDICAL RECORD NO.:  [id number]                   PATIENT TYPE:  INP   LOCATION:  2013                                 FACILITY:  [facility]   PHYSICIAN:  Willa Rough, M.D.                  DATE OF BIRTH:  18-Jan-1941   DATE OF ADMISSION:  01/02/2003  DATE OF DISCHARGE:  01/03/2003                           DISCHARGE SUMMARY - REFERRING   DISCHARGE DIAGNOSES:  1. Admitted with unstable angina.  2. Exercise Cardiolite December 26, 2002.  No electrocardiographic changes.     Prior inferior infarct with mild peri-infarct ischemia.  Ejection     fraction 55%.  3. Left circumflex totally occluded at the distal third.  Distal circumflex     is fed by way of collaterals from the right coronary artery and from the     obtuse marginal of the proximal left circumflex.  The study, which was     done January 03, 2003, is consistent with findings on left heart     catheterization August 2001.   SECONDARY DIAGNOSES:  1. Known coronary artery disease status post catheterization August 2001     with 100% occlusion of the left circumflex treated medically.  2. History of supraventricular tachycardia on verapamil.  3. Dyslipidemia.  4. Peptic ulcer disease/history of gastrointestinal bleed.  Surgery July     1999.  5. History of granulomatous lung disease.  6. Status post appendectomy.  7. Hiatal hernia.   PROCEDURES:  January 03, 2003, left heart catheterization, left  ventriculogram, coronary angiography.  This study was consistent with the  findings of the catheterization in August 2001.  There is a total occlusion  at the distal one-third of the left circumflex.  The distal circumflex after  the occlusion is fed by collaterals from the right coronary artery and from  the obtuse marginal of the left circumflex.  There is a 50% stenosis at the  first diagonal coming off the LAD.  The patient will be discharged after two-  hour rest period and placed on Imdur 30 mg as a new medicine.   DISPOSITION:  Mr. Dennis Combs is ready for discharge two hours after left  heart catheterization done January 03, 2003.  He has been afebrile this  hospitalization.  At the time of discharge, he is basically chest pain free.  This study was consistent with earlier catheterization report.  No new  stenoses were uncovered.  The right groin catheterization site is without  swelling, drainage, erythema, ecchymoses.  He has good perfusion to the  right lower extremity.  Mr. Dennis Combs goes home on the following medications.   DISCHARGE MEDICATIONS:  1. Verapamil 180 mg daily.  2. Lipitor 20 mg daily.  3. Protonix 40 mg daily.  4. Aspirin 81 mg daily.  5. Imdur 30 mg daily.  6. Nitroglycerin 0.4 mg 1 tablet under the tongue every  5 minutes as needed     for chest pain.  7. His pain management for the right groin can be Tylenol 325 mg 1 to 2     tablets every 4 to 6 hours as needed.   ACTIVITY:  He is asked not to drive.  No lifting of any heavy weights nor  engage in any sexual intercourse for the next two days.   DIET:  Low-sodium, low-cholesterol diet.   DISCHARGE INSTRUCTIONS:  He may shower.  He is to call [facility] at  [phone] if he experiences swelling or increased pain at the catheterization  site.  He will have followup with Dr. Sherryl Manges in two weeks.   BRIEF HISTORY:  Dennis Combs is a 70 year old male.  He has known coronary  artery disease.  He presents with about a six-week history of chest pain and  shortness of breath.  He denies that he has any exertional chest pain, but  the symptoms have increased in frequency.  He had a recent exercise  Cardiolite which showed mild peri-infarct ischemia and ejection fraction of  55%.  There was evidence of a prior inferior infarct.  No electrocardiogram  changes during the study.  The patient has not taken any nitroglycerin since  onset of symptoms.  In the  emergency room, the electrocardiogram once again  showed no acute changes, and initial cardiac markers were  within normal  limits.      Dennis Combs, P.A.                    Willa Rough, M.D.    GM/MEDQ  D:  01/03/2003  T:  01/03/2003  Job:  [id number]   cc:   Duke Salvia, M.D.   Angelena Sole, M.D. [facility]

## 2010-08-09 NOTE — Cardiovascular Report (Signed)
[place]  Patient:    Dennis Combs, Dennis Combs                      MRN: [id number] Proc. Date: 11/11/99 Adm. Date:  [id number] Disc. Date: [id number] Attending:  Nathen May CC:         Dewayne Hatch _____, M.D.  Nathen May, M.D. [facility]   Cardiac Catheterization  DATE OF BIRTH:  [birth year]  PRIMARY PHYSICIAN:  Dewayne Hatch ______, M.D.  CARDIOLOGIST:  Nathen May, M.D., [facility]  PROCEDURE:  Left heart cardiac catheterization/coronary arteriography.  INDICATIONS:  Evaluate patient with unstable angina.  DESCRIPTION OF PROCEDURE:  Left heart catheterization was performed via the right femoral artery.  The artery was cannulated using an anterior wall puncture.  A #6 French arterial sheath was inserted via the modified Seldinger technique.  Preformed Judkins and a pigtail catheter were utilized.  The patient tolerated the procedure well and left the lab in stable condition.  RESULTS:  HEMODYNAMICS:  LV 141/19, AO 140/72.  CORONARY ARTERIOGRAPHY:  Left main:  The left main coronary artery was normal.  Left anterior descending:  The LAD had luminal irregularities.  A small to medium sized first diagonal had ostial 30-40% stenosis.  Circumflex:  The circumflex coronary artery was a very large dominant vessel. There was a mid 100% stenosis with bridging collaterals.  The distal vessel was demonstrated to have some diffuse narrowing with focal distal 80% tandem lesions.  Right coronary artery:  The right coronary artery was nondominant and free of significant disease.  LEFT VENTRICULOGRAM:  The left ventriculogram was obtained in the RAO and LAO projections.  The EF was 65% with no wall motion abnormalities.  CONCLUSION:  Severe single-vessel coronary artery disease with what appears to be chronic circumflex occlusion in a dominant vessel.  This was in the mid segment after several large marginals.  The vessel is large beyond  this. However, from the appearance this is a vessel that would have low procedural and long-term chance of being open percutaneously.  PLAN:  The patient will have medical management including aggressive secondary risk factor modification with the addition of lipid-lowering agents.  He will stop smoking.  He will be placed on beta blockers and p.r.n. nitroglycerin. DD:  11/11/99 TD:  11/11/99 Job: [id number]

## 2010-08-09 NOTE — Discharge Summary (Signed)
   NAME:  Dennis Combs, Dennis Combs                         ACCOUNT NO.:  [id number]   MEDICAL RECORD NO.:  [id number]                   PATIENT TYPE:  INP   LOCATION:  2013                                 FACILITY:  [facility]   PHYSICIAN:  Maple Mirza, P.A.              DATE OF BIRTH:  September 12, 1940   DATE OF ADMISSION:  01/02/2003  DATE OF DISCHARGE:                                 DISCHARGE SUMMARY   ADDENDUM   DISCHARGE DIAGNOSES:  Cholelithiasis on ultrasound of the abdomen January 03, 2003. 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    GM/MEDQ  D:  01/03/2003  T:  01/03/2003  Job:  [id number]   cc:   Angelena Sole, M.D. [facility]   Duke Salvia, M.D.   Vania Rea. Jarold Motto, M.D. [facility]

## 2010-08-09 NOTE — Discharge Summary (Signed)
   NAME:  Dennis Combs, Dennis Combs                         ACCOUNT NO.:  [id number]   MEDICAL RECORD NO.:  [id number]                   PATIENT TYPE:  INP   LOCATION:  2013                                 FACILITY:  [facility]   PHYSICIAN:  Willa Rough, M.D.                  DATE OF BIRTH:  1941-01-16   DATE OF ADMISSION:  01/02/2003  DATE OF DISCHARGE:  01/03/2003                                 DISCHARGE SUMMARY   ADDENDUM:   LABORATORY DATA:  Cardiac enzymes:  Troponin-I less than 0.05, then 0.02,  then 0.01.  CK-MB:  Less than 1, then 1.3, then 1.2.  CK:  First 97, then  76.  Lipid profile:  Cholesterol 138, triglycerides 91, HDL cholesterol 51,  LDL cholesterol 69.  Liver function tests:  Alk phos 65, SGOT 22, SGPT 26.  Complete blood count on October 12:  White cells 8.7, hemoglobin 14.9,  hematocrit 44.3, platelets 207.      Maple Mirza, P.A.                    Willa Rough, M.D.    GM/MEDQ  D:  01/03/2003  T:  01/03/2003  Job:  [id number]   cc:   Angelena Sole, M.D. [facility]   Duke Salvia, M.D.

## 2010-08-15 ENCOUNTER — Other Ambulatory Visit: Payer: Self-pay | Admitting: Internal Medicine

## 2010-08-15 NOTE — Telephone Encounter (Signed)
30 and 3 RF 

## 2010-12-16 ENCOUNTER — Ambulatory Visit (INDEPENDENT_AMBULATORY_CARE_PROVIDER_SITE_OTHER)
Admission: RE | Admit: 2010-12-16 | Discharge: 2010-12-16 | Disposition: A | Discharge status: Home / Self Care | Payer: Medicare PPO | Source: Ambulatory Visit | Attending: Internal Medicine | Admitting: Internal Medicine

## 2010-12-16 ENCOUNTER — Encounter: Payer: Self-pay | Admitting: Internal Medicine

## 2010-12-16 ENCOUNTER — Ambulatory Visit (INDEPENDENT_AMBULATORY_CARE_PROVIDER_SITE_OTHER): Payer: Medicare PPO | Admitting: Internal Medicine

## 2010-12-16 ENCOUNTER — Other Ambulatory Visit: Payer: Self-pay | Admitting: Internal Medicine

## 2010-12-16 DIAGNOSIS — R6883 Chills (without fever): Secondary | ICD-10-CM | POA: Insufficient documentation

## 2010-12-16 DIAGNOSIS — R599 Enlarged lymph nodes, unspecified: Secondary | ICD-10-CM

## 2010-12-16 DIAGNOSIS — R591 Generalized enlarged lymph nodes: Secondary | ICD-10-CM | POA: Insufficient documentation

## 2010-12-16 LAB — POCT URINALYSIS DIPSTICK
Leukocytes, UA: NEGATIVE
Urobilinogen, UA: 0.2

## 2010-12-16 NOTE — Assessment & Plan Note (Addendum)
developed a mass at the R neck, s/p abx x 30 days Area much smaller now, no other LADs (except chronic findings @ the L groin) Reports a MRI at the [place] done last year in that area ?normal. Plan: Observe Call if lump returns or not completelly well in 2 weeks  Rec to get MRI  report to me

## 2010-12-16 NOTE — Patient Instructions (Addendum)
Please get the [place] records including the MRI report

## 2010-12-16 NOTE — Progress Notes (Signed)
  Subjective:    Patient ID: Dennis Combs, male    DOB: May 05, 1940, 70 y.o.   MRN: [id number]  HPI Last office visit about a year ago, he has been getting his care at the [place]. Here because 2 problems:  6 weeks ago developed a mass on the right neck, went to the [place], receive 20 days of doxycycline and then 10 days of amoxicillin. The mass eventually decreased from the size of a ping-pong ball to a 1 cm. No biopsy or culture was done from it.  He is also having x the last 7-8 months  chills and night sweats, on and off, sx getting more frequent lately. He apparently had a complete checkup a few months ago at the [place], reportedly the rectal exam and a PSA were normal, he had a chest x-ray then  and few days ago and no acute processes were found. He did have an MRI  of the neck last year  and "something was found", unclear what it was    Past Medical History: Lung TB dx ~ 1960 Anxiety Coronary artery disease per cath, no intervention needed  Diverticulitis, hx of GERD SVT AAA glucose impaired Hyperlipidemia  Past Surgical History: Reviewed history from 04/13/2009 and no changes required. 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 Cath 2004 for CP:  "The patient has total occlusion of the dominant circumflex which is stable compared with August 2001.  There is no change in his coronary anatomy to explain the change in his symptoms.  Will add nitrates for control of his angina"    Review of Systems Denies any dental pain or recent dental work, subjective fever. No weight loss, appetite okay. Rarely has a cough with occasional production of sputum which is very little. No hemoptysis No other  lumps at the axillary or groin area that he can tell. No dysuria, gross hematuria or difficulty urinating. Nondominant pain no blood in the  stools. Still smokes a pack a day, has not been drinking for the last few months      Objective:   Physical Exam  Constitutional: He is oriented to person, place, and time. He appears well-developed and well-nourished.  HENT:  Head: Normocephalic and atraumatic.  Right Ear: External ear normal.  Left Ear: External ear normal.       Poor dentition, throat without redness or discharge.  Neck: Normal range of motion. Neck supple. No thyromegaly present.         No supraclavicular mass  Cardiovascular: Normal rate, regular rhythm and normal heart sounds.   No murmur heard. Pulmonary/Chest: No respiratory distress. He has no wheezes. He has no rales.  Abdominal: Soft. He exhibits no distension. There is no tenderness. There is no rebound and no guarding.  Musculoskeletal: He exhibits no edema.  Neurological: He is alert and oriented to person, place, and time.  no axilary LADs No LADs R groin, at the L groin has a post surgical scar (from a Bx) and has 2 to 3 superficial , not attached to deeper structures , 1 cm lumps (chronic x years per pt)        Assessment & Plan:

## 2010-12-16 NOTE — Assessment & Plan Note (Addendum)
Several months h/o chills, fatigue, malignancy is a consideration. Pt reports a normal CXR recently and had a DRE and PSA ~ 3-12 at the [place] (normal ) Etiology unclear, physical exam essentially normal  DDX is  large from infex to maligancy to andropause to TB (h/o TB lung in the 1960) Plan: Repeat CXR Labs , UA UCX Consider further w/u including  CT lungs, Ct abdomen etc

## 2010-12-17 LAB — TSH: TSH: 1.2 u[IU]/mL (ref 0.35–5.50)

## 2010-12-17 LAB — CBC WITH DIFFERENTIAL/PLATELET
Basophils Absolute: 0.1 10*3/uL (ref 0.0–0.1)
Basophils Relative: 1.1 % (ref 0.0–3.0)
Eosinophils Absolute: 0.2 10*3/uL (ref 0.0–0.7)
Eosinophils Relative: 3.3 % (ref 0.0–5.0)
HCT: 46 % (ref 39.0–52.0)
Hemoglobin: 15.3 g/dL (ref 13.0–17.0)
Lymphocytes Relative: 21.4 % (ref 12.0–46.0)
Lymphs Abs: 1.3 10*3/uL (ref 0.7–4.0)
MCHC: 33.3 g/dL (ref 30.0–36.0)
MCV: 93.7 fl (ref 78.0–100.0)
Monocytes Absolute: 0.5 10*3/uL (ref 0.1–1.0)
Monocytes Relative: 9.1 % (ref 3.0–12.0)
Neutro Abs: 3.9 10*3/uL (ref 1.4–7.7)
Neutrophils Relative %: 65.1 % (ref 43.0–77.0)
Platelets: 208 10*3/uL (ref 150.0–400.0)
RBC: 4.91 Mil/uL (ref 4.22–5.81)
RDW: 12.9 % (ref 11.5–14.6)
WBC: 6 10*3/uL (ref 4.5–10.5)

## 2010-12-17 LAB — SEDIMENTATION RATE: Sed Rate: 9 mm/hr (ref 0–22)

## 2010-12-17 LAB — COMPREHENSIVE METABOLIC PANEL
ALT: 13 U/L (ref 0–53)
AST: 17 U/L (ref 0–37)
Albumin: 4.5 g/dL (ref 3.5–5.2)
Alkaline Phosphatase: 62 U/L (ref 39–117)
BUN: 9 mg/dL (ref 6–23)
CO2: 27 mEq/L (ref 19–32)
Calcium: 9.5 mg/dL (ref 8.4–10.5)
Chloride: 102 mEq/L (ref 96–112)
Creatinine, Ser: 0.8 mg/dL (ref 0.4–1.5)
GFR: 95.79 mL/min (ref >60.00)
Glucose, Bld: 99 mg/dL (ref 70–99)
Potassium: 5.2 mEq/L — ABNORMAL HIGH (ref 3.5–5.1)
Sodium: 141 mEq/L (ref 135–145)
Total Bilirubin: 1.1 mg/dL (ref 0.3–1.2)
Total Protein: 7.8 g/dL (ref 6.0–8.3)

## 2010-12-18 LAB — CULTURE, URINE COMPREHENSIVE: Organism ID, Bacteria: NO GROWTH

## 2010-12-20 ENCOUNTER — Telehealth: Payer: Self-pay | Admitting: Internal Medicine

## 2010-12-20 NOTE — Telephone Encounter (Signed)
Patient is requesting results from recent OV & chest Xray

## 2010-12-20 NOTE — Telephone Encounter (Signed)
Patient Informed. Mailed Results.

## 2010-12-31 ENCOUNTER — Other Ambulatory Visit: Payer: Self-pay | Admitting: Internal Medicine

## 2010-12-31 NOTE — Telephone Encounter (Signed)
Done

## 2011-01-06 ENCOUNTER — Ambulatory Visit (INDEPENDENT_AMBULATORY_CARE_PROVIDER_SITE_OTHER): Payer: Medicare PPO | Admitting: Internal Medicine

## 2011-01-06 ENCOUNTER — Encounter: Payer: Self-pay | Admitting: Internal Medicine

## 2011-01-06 VITALS — BP 118/64 | HR 81 | Temp 98.3°F | Resp 16 | Wt 182.2 lb

## 2011-01-06 DIAGNOSIS — R5383 Other fatigue: Secondary | ICD-10-CM

## 2011-01-06 DIAGNOSIS — I2510 Atherosclerotic heart disease of native coronary artery without angina pectoris: Secondary | ICD-10-CM

## 2011-01-06 DIAGNOSIS — R634 Abnormal weight loss: Secondary | ICD-10-CM

## 2011-01-06 DIAGNOSIS — I714 Abdominal aortic aneurysm, without rupture: Secondary | ICD-10-CM

## 2011-01-06 DIAGNOSIS — R61 Generalized hyperhidrosis: Secondary | ICD-10-CM

## 2011-01-06 NOTE — Progress Notes (Signed)
Subjective:    Patient ID: Dennis Combs, male    DOB: [birth year], 70 y.o.   MRN: [id number]  HPI Here with his wife, since the last office visit the LAD at the right side of his jaw has decreased in size  and is not  tender. He continue to have on and off chills and subjective fever, worse at night.  Past Medical History  Diagnosis Date  . Anxiety   . CAD (coronary artery disease)     last catheterization 2004, medical management.  . Diverticulitis     History of  . GERD (gastroesophageal reflux disease)   . PUD (peptic ulcer disease)     History of  . AAA (abdominal aortic aneurysm)     dx 2007   . Hyperglycemia     A1c   5.9  2011  . Hyperlipidemia    Past Surgical History  Procedure Date  . Appendectomy   . Tonsillectomy   . Abdominal surgery     Vagotomy  and pyloroplasty due to bleeding ulcer, 1999     Review of Systems Denies any nausea, vomiting or diarrhea. No blood in the stools. Occasionally he belched Admits to a lot of fatigue, more than usual. He has a history of snoring (> 50 years per wife) and also feeling sleepy throughout the day but no inappropriate falling asleep Denies any cough, chest congestion or difficulty breathing Denies headaches but admits to pain around the shoulders, no pain around hips. He does have ill defined myalgias. Some weight loss. No headaches per se. Admits to depression (thinks is b/c he feels physically poorly)    Objective:   Physical Exam  Constitutional: He appears well-developed and well-nourished.  HENT:  Head: Normocephalic and atraumatic.  Right Ear: External ear normal.  Left Ear: External ear normal.       Previously felt  LAD at the R side of the jaw is still there, slightly smaller, nontender or fluctuant. No tender at the mastoid area  Neck:       No supraclavicular mass  Cardiovascular: Normal rate, regular rhythm and normal heart sounds.   No murmur heard. Pulmonary/Chest: Effort normal and breath  sounds normal. No respiratory distress. He has no rales.  Abdominal: Soft. Bowel sounds are normal. He exhibits no distension. There is no tenderness. There is no rebound and no guarding.  Musculoskeletal: He exhibits no edema.       Mild discomfort to palpation on the muscles around the shoulders. No pain to palpation in the lower extremities.            Assessment & Plan:  Presents with the following problems: 1. At least  8 week history of subjective fevers, chills 2. Fatigue 3. Resulting neck LAD 4. Weight loss, a year ago he weighed 193, today 182 pounds. 5. Last year he was seen at ENT  [facility])  with what seemed to be inflammation of the salivary gland, that prompted a MRI of the area which was "ababnormal" , no reports available; currently he is essentially asymptomatic except for mild right ear pain. The chart is reviewed, recent chest x-ray urine culture, CBC, TSH, sedimentation rate were all normal. Differential diagnoses is large but includes  occult malignancy, occult  Infection (mastoid?), sleep apnea could account for the fatigue, depression?. Plan: Check a abdominal ultrasound to rule out kidney malignancy (will f/u on AAA as well) CT of the mastoids Refer to ID. Further work ?

## 2011-01-07 ENCOUNTER — Telehealth: Payer: Self-pay | Admitting: Internal Medicine

## 2011-01-07 ENCOUNTER — Encounter: Payer: Self-pay | Admitting: Internal Medicine

## 2011-01-07 DIAGNOSIS — H7090 Unspecified mastoiditis, unspecified ear: Secondary | ICD-10-CM

## 2011-01-07 DIAGNOSIS — I2510 Atherosclerotic heart disease of native coronary artery without angina pectoris: Secondary | ICD-10-CM | POA: Insufficient documentation

## 2011-01-07 NOTE — Telephone Encounter (Signed)
Per [facility], order CT by entering..... CT Temporal, choose Bone without contrast.  This test has to be performed at hospital only.

## 2011-01-07 NOTE — Assessment & Plan Note (Signed)
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 Cath 2004 for CP:  "The patient has total occlusion of the dominant circumflex which is stable compared with August 2001.  There is no change in his coronary anatomy to explain the change in his symptoms.  Will add nitrates for control of his angina"

## 2011-01-07 NOTE — Telephone Encounter (Signed)
Order entered

## 2011-01-07 NOTE — Telephone Encounter (Signed)
Please discuss with radiology, I need to order a  "mastoid CT" , can't find it in the computer.Dx fever, chills, history of mastoiditis.

## 2011-01-10 ENCOUNTER — Ambulatory Visit
Admission: RE | Admit: 2011-01-10 | Discharge: 2011-01-10 | Disposition: A | Discharge status: Home / Self Care | Payer: Medicare PPO | Source: Ambulatory Visit | Attending: Internal Medicine | Admitting: Internal Medicine

## 2011-01-10 DIAGNOSIS — I714 Abdominal aortic aneurysm, without rupture: Secondary | ICD-10-CM

## 2011-01-10 DIAGNOSIS — R61 Generalized hyperhidrosis: Secondary | ICD-10-CM

## 2011-01-10 DIAGNOSIS — H7090 Unspecified mastoiditis, unspecified ear: Secondary | ICD-10-CM

## 2011-01-17 ENCOUNTER — Encounter: Payer: Self-pay | Admitting: Infectious Diseases

## 2011-01-17 ENCOUNTER — Ambulatory Visit (INDEPENDENT_AMBULATORY_CARE_PROVIDER_SITE_OTHER): Payer: Medicare PPO | Admitting: Infectious Diseases

## 2011-01-17 VITALS — BP 121/79 | HR 93 | Temp 99.0°F | Ht 71.0 in | Wt 192.0 lb

## 2011-01-17 DIAGNOSIS — R591 Generalized enlarged lymph nodes: Secondary | ICD-10-CM

## 2011-01-17 DIAGNOSIS — R599 Enlarged lymph nodes, unspecified: Secondary | ICD-10-CM

## 2011-01-17 LAB — CBC WITH DIFFERENTIAL/PLATELET
Basophils Absolute: 0.1 10*3/uL (ref 0.0–0.1)
Basophils Relative: 1 % (ref 0–1)
Hemoglobin: 14.5 g/dL (ref 13.0–17.0)
MCH: 30.5 pg (ref 26.0–34.0)
MCHC: 33.6 g/dL (ref 30.0–36.0)
Neutro Abs: 6 10*3/uL (ref 1.7–7.7)
Platelets: 223 10*3/uL (ref 150–400)
RBC: 4.76 MIL/uL (ref 4.22–5.81)
RDW: 13 % (ref 11.5–15.5)

## 2011-01-17 NOTE — Assessment & Plan Note (Signed)
His constellation of symptoms, CXR, and his history suggest TB. Will check a serum quantiferon gold on him as well as a CT chest. Unfortunately he did not have biopsy when he had the previous auricular lymphadenopathy. Hopefully his CT will show a lesion that can be biopsied.  Other diagnostic considerations would be occult malignancy (lymphoma or lung). Will hold any therapy at this point. Will see him back in 2 weeks

## 2011-01-17 NOTE — Progress Notes (Signed)
  Subjective:    Patient ID: Dennis Combs, male    DOB: January 05, 1941, 70 y.o.   MRN: [id number]  HPI 70 yo M with hx CAD, increased lipids, and of chills for several months. Has since developed night sweats. He had a LN behind his R ear. Took amoxicillin as well as 20 days of doxycycline. He has not had LN bx.  Has been increasingly fatigued, has to change clothes at night due to sweats. Has been dizzy as well. Temp has gotten up to 100 at home. No specific pain.  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 States he was treated (with INH) and has since been declared negative. He has no cough but feels like he has pneumonia.  CXR 12-16-10: No active lung disease. Slight hyperaeration. Calcified  granulomas consistent with prior granulomatous disease. CT temporal bones 01-10-11- negative.    Review of Systems  Constitutional: Positive for fever, chills and activity change. Negative for appetite change and unexpected weight change.  Respiratory: Negative for cough.   Gastrointestinal: Negative for diarrhea and constipation.  Genitourinary: Negative for dysuria.  Hematological: Positive for adenopathy.       Objective:   Physical Exam  Constitutional: He appears well-developed and well-nourished.  Eyes: EOM are normal. Pupils are equal, round, and reactive to light.  Neck: Neck supple.  Cardiovascular: Normal rate, regular rhythm and normal heart sounds.   Pulmonary/Chest: Effort normal and breath sounds normal. He has no wheezes.  Abdominal: Soft. Bowel sounds are normal. There is no tenderness.  Musculoskeletal: Normal range of motion.  Lymphadenopathy:       Head (right side): No submental, no submandibular and no preauricular adenopathy present.    He has no cervical adenopathy.    He has no axillary adenopathy.          Assessment & Plan:

## 2011-01-20 ENCOUNTER — Ambulatory Visit
Admission: RE | Admit: 2011-01-20 | Discharge: 2011-01-20 | Disposition: A | Discharge status: Home / Self Care | Payer: Medicare PPO | Source: Ambulatory Visit | Attending: Infectious Diseases | Admitting: Infectious Diseases

## 2011-01-20 DIAGNOSIS — R591 Generalized enlarged lymph nodes: Secondary | ICD-10-CM

## 2011-01-21 LAB — QUANTIFERON TB GOLD ASSAY (BLOOD): Interferon Gamma Release Assay: POSITIVE — AB

## 2011-01-23 ENCOUNTER — Telehealth: Payer: Self-pay | Admitting: *Deleted

## 2011-01-23 ENCOUNTER — Other Ambulatory Visit: Payer: Self-pay | Admitting: *Deleted

## 2011-01-23 NOTE — Telephone Encounter (Signed)
He wanted to know if the labs were back yet. Told him one was & what it was. The other is still pending. He also wanted to know what the imaging report said. I read parts of it to him.   He has an appt in 2 weeks. Told him to keep it. He is concerned about the tylenol & his liver. He cannot take NSAIDS or ASA per pt because of his medical history. Stopped drinking 6 months ago. C/o inconsistent night sweats. Fevers run from 99 to 101.   Wants md to let him what to do about the sweats & fevers until the appt. Told him md or I will call him back when md responds

## 2011-01-27 ENCOUNTER — Ambulatory Visit: Payer: Medicare PPO | Admitting: Internal Medicine

## 2011-01-29 NOTE — Telephone Encounter (Signed)
Called to see how he was. Last night. 100.9 . Otherwise fine per pt. Then he mentions a "little chest pain" that goes away after he belches. Has cardiac issues. Advised a check up with his cardiologist as it has been  Over a year. Has never used his NTG. Advised to keep taking antipyretics, drink plenty. Call if worsens or use ED if worse & we are closed

## 2011-01-31 ENCOUNTER — Telehealth: Payer: Self-pay | Admitting: *Deleted

## 2011-01-31 NOTE — Telephone Encounter (Signed)
States he is doing much better. No fever or sweats last night. Has felt well for 2 days now. He has not called his cardiologist. Renae Gloss him to do this as an appt may be 2-3 months from now. He agreed that he will. He is staying hydrated & will let us know if he gets worse

## 2011-02-05 ENCOUNTER — Ambulatory Visit (INDEPENDENT_AMBULATORY_CARE_PROVIDER_SITE_OTHER): Payer: Medicare PPO | Admitting: Infectious Diseases

## 2011-02-05 ENCOUNTER — Telehealth: Payer: Self-pay | Admitting: *Deleted

## 2011-02-05 ENCOUNTER — Encounter: Payer: Self-pay | Admitting: Infectious Diseases

## 2011-02-05 DIAGNOSIS — R6883 Chills (without fever): Secondary | ICD-10-CM

## 2011-02-05 DIAGNOSIS — A4902 Methicillin resistant Staphylococcus aureus infection, unspecified site: Secondary | ICD-10-CM

## 2011-02-05 DIAGNOSIS — L0291 Cutaneous abscess, unspecified: Secondary | ICD-10-CM

## 2011-02-05 DIAGNOSIS — B9562 Methicillin resistant Staphylococcus aureus infection as the cause of diseases classified elsewhere: Secondary | ICD-10-CM | POA: Insufficient documentation

## 2011-02-05 DIAGNOSIS — L0390 Cellulitis, unspecified: Secondary | ICD-10-CM | POA: Insufficient documentation

## 2011-02-05 MED ORDER — DOXYCYCLINE MONOHYDRATE 100 MG PO CAPS: 100.0000 mg | ORAL_CAPSULE | Freq: Two times a day (BID) | ORAL | Status: AC

## 2011-02-05 NOTE — Progress Notes (Signed)
  Subjective:    Patient ID: Dennis Combs, male    DOB: 04/07/1940, 70 y.o.   MRN: [id number]  HPI 70 yo M with hx CAD, increased lipids, and chills for several months. Has since developed night sweats. He had a LN behind his R ear. Took amoxicillin as well as 20 days of doxycycline.   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 States he was treated (with INH) and has since been declared negative. He has no cough but feels like he has pneumonia.  CXR 12-16-10: No active lung disease. Slight hyperaeration. Calcified  granulomas consistent with prior granulomatous disease.  CT temporal bones 01-10-11- negative. He was seen in ID 01-17-11 and had ESR nl, TSH nl, AFB BCx ngtd, and a CT chest 1. No active cardiopulmonary abnormalities. 2. 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 He had a quantiferon gold done on 10-26 that was positive.  Today states he has continued to have fevers (to 100.6 this AM) and chills. No further LAN since his previous.        Review of Systems States he has a productive cough, all day long. He attributes this to smoking.     Objective:   Physical Exam  Constitutional: He appears well-developed and well-nourished.  Eyes: EOM are normal. Pupils are equal, round, and reactive to light.  Neck: Neck supple.  Cardiovascular: Normal rate, regular rhythm and normal heart sounds.   Pulmonary/Chest: Effort normal and breath sounds normal.  Abdominal: Soft. Bowel sounds are normal. He exhibits no distension.  Lymphadenopathy:    He has no cervical adenopathy.    He has no axillary adenopathy.  Skin:             Assessment & Plan:

## 2011-02-05 NOTE — Assessment & Plan Note (Signed)
Will give him 2 weeks of doxycycline. Asked him to keep his wound covered.

## 2011-02-05 NOTE — Telephone Encounter (Signed)
I left a message with Awilda Metro asking her to call back with an appt with Dr. Roxan Hockey for this [phone]

## 2011-02-05 NOTE — Assessment & Plan Note (Signed)
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 Will ask for appt with Dr Burnice Logan for eval at health dept.

## 2011-02-06 NOTE — Telephone Encounter (Signed)
I called the health dept again. I declined to leave another message with Tammy Faucette. I left a message with Marisa Sprinkles & asked her to call me about getting this pt an appt.

## 2011-02-07 ENCOUNTER — Ambulatory Visit: Payer: Medicare PPO | Admitting: Infectious Diseases

## 2011-02-07 NOTE — Telephone Encounter (Signed)
I reached Dennis Combs. She asked that i fax records to her. They will contact the pt & get him in to be seen. Faxed to [phone]

## 2011-02-12 ENCOUNTER — Ambulatory Visit: Payer: Medicare PPO | Admitting: Infectious Diseases

## 2011-02-12 ENCOUNTER — Telehealth: Payer: Self-pay | Admitting: *Deleted

## 2011-02-12 NOTE — Telephone Encounter (Signed)
Wife reports that the wound is worsening despite pt being on new antibiotics.  Requesting additional lab work.  RN advised that pt needs to be evaluated.  Pt scheduled appt for this afternoon with Dr. Ninetta Lights.

## 2011-02-19 ENCOUNTER — Telehealth: Payer: Self-pay | Admitting: *Deleted

## 2011-02-19 NOTE — Telephone Encounter (Signed)
Patient was referred to [facility]. For possible TB and he lives in [place].  Records will be forwarded from [facility] to [place] and patient will be notified of appointment, waiting for call back from TB nurse in [facility]. Wendall Mola CMA

## 2011-02-21 ENCOUNTER — Telehealth: Payer: Self-pay | Admitting: *Deleted

## 2011-02-21 NOTE — Telephone Encounter (Signed)
Spoke with Dennis Combs at [facility] [phone] and she called patient to have him bring in the sputum samples he had collected for [facility].  I faxed her a copy of his chest xray from 12/16/10.  Ignacia Palma will be seeing him for possible TB and wants the blood culture results faxed once they come back.  Fax # [phone] Wendall Mola CMA

## 2011-02-27 ENCOUNTER — Telehealth: Payer: Self-pay | Admitting: *Deleted

## 2011-02-27 NOTE — Telephone Encounter (Signed)
Patient called very concerned about his blood culture results.  Called [facility] lab and the final result will be released tomorrow, as of right now it is negative.  Notified patient of this and will fax the final result to [facility] when it is released.  Patient is still c/o off and on fevers. Dennis Combs CMA

## 2011-03-04 LAB — AFB CULTURE, BLOOD

## 2011-03-04 NOTE — Telephone Encounter (Signed)
AFB culture results faxed to [facility]. Wendall Mola CMA

## 2011-03-06 ENCOUNTER — Telehealth: Payer: Self-pay | Admitting: *Deleted

## 2011-03-06 NOTE — Telephone Encounter (Signed)
Call from Ava at [facility], sputum cultures were negative and asked if Dr. Ninetta Lights wanted to order another set of cultures. Notified Ava Dr. Ninetta Lights does want the sputum cultures repeated. Dennis Combs CMA

## 2011-03-10 ENCOUNTER — Telehealth: Payer: Self-pay | Admitting: *Deleted

## 2011-03-10 ENCOUNTER — Telehealth: Payer: Self-pay | Admitting: Internal Medicine

## 2011-03-10 DIAGNOSIS — I2510 Atherosclerotic heart disease of native coronary artery without angina pectoris: Secondary | ICD-10-CM

## 2011-03-10 NOTE — Telephone Encounter (Signed)
Patient calling, states for the longest time Dr. Drue Novel has wanted him to follow up with his cardiologist for ?CAD?, therefore he is calling requesting a new referral for [place] to schedule his appointment, and he prefers Dr. Graciela Husbands.  Please advise.

## 2011-03-10 NOTE — Telephone Encounter (Signed)
States he needs a referral to [facility]. Has seen md before for CAD. There are references to this by his pcp. Urged him to call them & ask for the referral as they are aware & he has been seen recently. States he will

## 2011-03-11 NOTE — Telephone Encounter (Signed)
Patient says he already has an appointment to see Dr. Drue Novel in January, and is disappointed because Dr. Drue Novel is the one who keeps telling him to follow up with Cards.  Today patient informs that he is having symptoms of SOB, slight Chest pain, and that he "just knows" he has a blockage.  Patient does want a referral to Cards, and states his symptoms are not enough to send him to the emergency room because they are so minimal.  Today, 03-11-11, is the first patient mentioned of even having any symptoms.  Please enter referral, and/or advise.

## 2011-03-11 NOTE — Telephone Encounter (Signed)
I think he actually is stable from the cardiac standpoint. My suggestion is for him to schedule a complete physical at his convenience, but will give me the chance to go over his chart and see if further workup is needed

## 2011-03-13 NOTE — Telephone Encounter (Signed)
Spoke w/patient about symptoms he is having: Chest pain & tightness, SOB & trouble breathing. Explained to patient that we are getting the referral process into Cardiology to see Dr. Graciela Husbands but it could take over a week before he is seen and with his medical Hx [superventricular tachycardia, CAD] I implored patient to please report to ED for assessment & evaluation where they have the equipment & ability to do the testing that is needed.  Pt states that he doesn't feel that symptoms are bad enough to go to ED. Informed patient again that w/his symptoms & Hx, I can only again implore him to seek medical attention now. Pt said that he would "think about it". Urged Pt to keep MD recommendation in mind and call us if [he did not go to ED] he has concerns before OV w/cardiology. Gave patient contact name & number.

## 2011-03-13 NOTE — Telephone Encounter (Signed)
Noted, thank you

## 2011-03-13 NOTE — Telephone Encounter (Signed)
1. Cards referral entered 2. If CP-SOB has to go to the ER !

## 2011-04-09 ENCOUNTER — Encounter: Payer: Self-pay | Admitting: Internal Medicine

## 2011-04-09 ENCOUNTER — Ambulatory Visit (INDEPENDENT_AMBULATORY_CARE_PROVIDER_SITE_OTHER): Payer: Medicare PPO | Admitting: Internal Medicine

## 2011-04-09 ENCOUNTER — Encounter: Payer: Self-pay | Admitting: Cardiology

## 2011-04-09 VITALS — BP 122/70 | HR 97 | Temp 98.5°F | Wt 177.0 lb

## 2011-04-09 DIAGNOSIS — R350 Frequency of micturition: Secondary | ICD-10-CM

## 2011-04-09 DIAGNOSIS — I2510 Atherosclerotic heart disease of native coronary artery without angina pectoris: Secondary | ICD-10-CM

## 2011-04-09 DIAGNOSIS — I714 Abdominal aortic aneurysm, without rupture: Secondary | ICD-10-CM

## 2011-04-09 DIAGNOSIS — IMO0001 Reserved for inherently not codable concepts without codable children: Secondary | ICD-10-CM

## 2011-04-09 DIAGNOSIS — R6883 Chills (without fever): Secondary | ICD-10-CM

## 2011-04-09 DIAGNOSIS — N509 Disorder of male genital organs, unspecified: Secondary | ICD-10-CM

## 2011-04-09 DIAGNOSIS — N50819 Testicular pain, unspecified: Secondary | ICD-10-CM

## 2011-04-09 DIAGNOSIS — E785 Hyperlipidemia, unspecified: Secondary | ICD-10-CM

## 2011-04-09 DIAGNOSIS — R7301 Impaired fasting glucose: Secondary | ICD-10-CM

## 2011-04-09 LAB — COMPREHENSIVE METABOLIC PANEL
ALT: 12 U/L (ref 0–53)
Albumin: 4.6 g/dL (ref 3.5–5.2)
Alkaline Phosphatase: 57 U/L (ref 39–117)
CO2: 29 mEq/L (ref 19–32)
GFR: 95.7 mL/min (ref >60.00)
Glucose, Bld: 114 mg/dL — ABNORMAL HIGH (ref 70–99)
Potassium: 4.2 mEq/L (ref 3.5–5.1)
Sodium: 140 mEq/L (ref 135–145)

## 2011-04-09 LAB — POCT URINALYSIS DIPSTICK
Leukocytes, UA: NEGATIVE
Nitrite, UA: NEGATIVE
Protein, UA: NEGATIVE
Urobilinogen, UA: 0.2
pH, UA: 6

## 2011-04-09 LAB — CBC WITH DIFFERENTIAL/PLATELET
Basophils Absolute: 0.1 10*3/uL (ref 0.0–0.1)
Eosinophils Absolute: 0.2 10*3/uL (ref 0.0–0.7)
HCT: 46.1 % (ref 39.0–52.0)
Lymphocytes Relative: 12.2 % (ref 12.0–46.0)
Lymphs Abs: 1 10*3/uL (ref 0.7–4.0)
MCHC: 33.8 g/dL (ref 30.0–36.0)
Monocytes Absolute: 0.6 10*3/uL (ref 0.1–1.0)
Neutro Abs: 6.4 10*3/uL (ref 1.4–7.7)
RBC: 5.01 Mil/uL (ref 4.22–5.81)
RDW: 13.3 % (ref 11.5–14.6)

## 2011-04-09 LAB — HEMOGLOBIN A1C: Hgb A1c MFr Bld: 5.7 % (ref 4.6–6.5)

## 2011-04-09 LAB — LIPID PANEL
Cholesterol: 204 mg/dL — ABNORMAL HIGH (ref 0–200)
Total CHOL/HDL Ratio: 4
Triglycerides: 64 mg/dL (ref 0.0–149.0)

## 2011-04-09 LAB — LDL CHOLESTEROL, DIRECT: Direct LDL: 152.8 mg/dL

## 2011-04-09 NOTE — Assessment & Plan Note (Signed)
Labs

## 2011-04-09 NOTE — Assessment & Plan Note (Signed)
Will check labs

## 2011-04-09 NOTE — Assessment & Plan Note (Addendum)
Complaints of testicular pain, testicles are slightly tender on palpation, epididymis nontender. Unclear  etiology. Check a ultrasound

## 2011-04-09 NOTE — Assessment & Plan Note (Addendum)
Continue with chills, night sweats and weight loss. Status post ID evaluation. 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 Ultrasound of the abdomen 12/31/2010 showed some lesions in the fever, apparently not new but they recommended a MRI if malignancy is suspected. Plan: I will contact ID to see what is the next step, empiric therapy? Further workup?  Also I think he will need an MRI of the abdomen to reassess the liver

## 2011-04-09 NOTE — Assessment & Plan Note (Signed)
Ultrasound of the abdomen October 2012---Abdominal aortic aneurysm measuring up to 3.1 cm.

## 2011-04-09 NOTE — Progress Notes (Signed)
  Subjective:    Patient ID: Dennis Combs, male    DOB: 07-23-1940, 71 y.o.   MRN: [id number]  HPI Here with his wife, several issues --Continue with weight loss, severe chills and night sweats. --Ongoing chest pain and shortness of breath, has an appointment to see cardiology tomorrow. Unfortunately he discontinue aspirin. 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 Cath 2004 for CP:  "The patient has total occlusion of the dominant circumflex which is stable compared with August 2001.  There is no change in his coronary anatomy to explain the change in his symptoms.  Will add nitrates for control of his angina"  Family History: Prostate Ca - DAD colon ca--no DM - M MI-- no  Social History: Married, 4 children tobacco--  > 1PPD, to get counseling at the [place] soon  ETOH-- "almost daily, working on cutting down"-- to get counseling at the [place] soon still very active    Review of Systems Denies fevers per se occ cough, sometimes produces white sputum.  Chest pain is on and off, random, not exertional, may last 5-10 minutes, it could be in the left or right chest, not associated with nausea or diaphoresis. He also continually DOE for several moments, also short of breath if he bends over, slightly getting worse. Denies dysuria or gross hematuria. Occasional difficulty urinating Did not get a flu shot Continue with tobacco, approximately half pack a day.    Objective:   Physical Exam  Constitutional: He is oriented to person, place, and time. He appears well-developed. No distress.  HENT:  Head: Normocephalic and atraumatic.  Neck: No thyromegaly present.  Cardiovascular: Normal rate, regular rhythm and normal heart sounds.   No murmur heard. Pulmonary/Chest: Effort normal and breath sounds normal. No respiratory distress. He has no wheezes. He has no rales.  Genitourinary:       Penis normal. Testicles symmetric, moderately tender bilaterally without mass. Epididymis normal.  Groins without hernia, mass or lymphadenopathy. Prostate is normal in size, not nodular, not particularly tender.  Musculoskeletal: He exhibits no edema.       No axillary lymphadenopathy  Lymphadenopathy:    He has no cervical adenopathy.  Neurological: He is alert and oriented to person, place, and time.  Skin: He is not diaphoretic.       Several dry and hyperpigmented lesions in the legs       Assessment & Plan:  Rash-- has an erythematous rash in the leg, at the present time there is post inflammatory hyperpigmentation, apparently not active lesions. Recommend observation

## 2011-04-09 NOTE — Assessment & Plan Note (Signed)
Atypical chest pain, see review of systems. Symptoms are not  crescendo, we'll see cardiology tomorrow. Labs

## 2011-04-10 ENCOUNTER — Ambulatory Visit (INDEPENDENT_AMBULATORY_CARE_PROVIDER_SITE_OTHER): Payer: Medicare PPO | Admitting: Cardiology

## 2011-04-10 ENCOUNTER — Telehealth: Payer: Self-pay | Admitting: Internal Medicine

## 2011-04-10 ENCOUNTER — Encounter: Payer: Self-pay | Admitting: Cardiology

## 2011-04-10 ENCOUNTER — Encounter: Payer: Self-pay | Admitting: Internal Medicine

## 2011-04-10 DIAGNOSIS — Z8679 Personal history of other diseases of the circulatory system: Secondary | ICD-10-CM

## 2011-04-10 DIAGNOSIS — F17200 Nicotine dependence, unspecified, uncomplicated: Secondary | ICD-10-CM

## 2011-04-10 DIAGNOSIS — R0602 Shortness of breath: Secondary | ICD-10-CM

## 2011-04-10 DIAGNOSIS — I498 Other specified cardiac arrhythmias: Secondary | ICD-10-CM

## 2011-04-10 DIAGNOSIS — R269 Unspecified abnormalities of gait and mobility: Secondary | ICD-10-CM | POA: Insufficient documentation

## 2011-04-10 DIAGNOSIS — E7800 Pure hypercholesterolemia, unspecified: Secondary | ICD-10-CM

## 2011-04-10 DIAGNOSIS — I471 Supraventricular tachycardia: Secondary | ICD-10-CM

## 2011-04-10 DIAGNOSIS — I2510 Atherosclerotic heart disease of native coronary artery without angina pectoris: Secondary | ICD-10-CM

## 2011-04-10 DIAGNOSIS — Z720 Tobacco use: Secondary | ICD-10-CM | POA: Insufficient documentation

## 2011-04-10 DIAGNOSIS — E785 Hyperlipidemia, unspecified: Secondary | ICD-10-CM

## 2011-04-10 MED ORDER — ATORVASTATIN CALCIUM 40 MG PO TABS: 40.0000 mg | ORAL_TABLET | Freq: Every day | ORAL | Status: DC

## 2011-04-10 NOTE — Assessment & Plan Note (Signed)
I would suggest a neurology appointment to further evaluate this. I do not suspect a vascular etiology.

## 2011-04-10 NOTE — Assessment & Plan Note (Signed)
I am going to pursue a 21 day of a monitor to quantify this. Ablation might be reasonable if he is having this frequently. A TSH in the fall was normal. Electrolytes yesterday were normal.

## 2011-04-10 NOTE — Progress Notes (Signed)
HPI The patient presents for followup of known coronary disease. He has had catheterizations in 2001 and 2004. I have reviewed these. The most recent catheterization demonstrated first diagonal 50% ostial stenosis. The circumflex was large and occluded in the distal segment. There is a left-sided PDA that is collateralized. The right coronary artery was a moderate size nondominant vessel free of high-grade disease. He does not report routine cardiology followup or testing since then. He does report that he's had an SVT long standing but he is having this more frequently. He says he's had cardioversion x2 last year. He gets this dysrhythmia several times in a month he can typically at rate of it with vagal maneuvers. However, he has been having increasing symptoms such as fatigue. He's had decreased exercise tolerance. He gets dyspneic with moderate exertion though he's not describing PND or orthopnea. He does have chest discomfort. However, this is sporadic. It happens on different spots of his chest. He doesn't describe neck or arm discomfort. He doesn't describe associated nausea vomiting or diaphoresis. He also describes a gait disturbance. He has trouble with his balance and has actually given up yard work because of this. He has not been compliant with lifestyle modification and has taken himself off statins over the years. He continues to smoke cigarettes.  Allergies  Allergen Reactions  . Codeine     REACTION: whelps,rash,itching  . Ivp Dye (Iodinated Diagnostic Agents) Hives and Itching  . Metronidazole     REACTION: n,v,d    Current Outpatient Prescriptions  Medication Sig Dispense Refill  . LORazepam (ATIVAN) 0.5 MG tablet Take 0.5 mg by mouth every 8 (eight) hours.      . nitroGLYCERIN (NITROSTAT) 0.4 MG SL tablet Place 0.4 mg under the tongue every 5 (five) minutes as needed.        . verapamil (VERELAN PM) 180 MG 24 hr capsule TAKE ONE CAPSULE BY MOUTH DAILY  30 capsule  3  .  vitamin C (ASCORBIC ACID) 250 MG tablet Take 1,000 mg by mouth daily.        Marland Kitchen zolpidem (AMBIEN) 5 MG tablet Take 2.5 mg by mouth at bedtime as needed.          Past Medical History  Diagnosis Date  . Anxiety   . CAD (coronary artery disease)     last catheterization 2004, medical management.  . Diverticulitis     History of  . GERD (gastroesophageal reflux disease)   . PUD (peptic ulcer disease)     History of  . AAA (abdominal aortic aneurysm)     dx 2007   . Hyperglycemia     A1c   5.9  2011  . Hyperlipidemia     Past Surgical History  Procedure Date  . Appendectomy   . Tonsillectomy   . Abdominal surgery     Vagotomy  and pyloroplasty due to bleeding ulcer, 1999    Family History  Problem Relation Age of Onset  . Diabetes Mother   . Cancer Father     prostate    History   Social History  . Marital Status: Married    Spouse Name: N/A    Number of Children: N/A  . Years of Education: N/A   Occupational History  . Not on file.   Social History Main Topics  . Smoking status: Current Everyday Smoker -- 1.5 packs/day for 50 years    Types: Cigarettes  . Smokeless tobacco: Never Used  . Alcohol Use: No  .  Drug Use: No  . Sexually Active: Not on file   Other Topics Concern  . Not on file   Social History Narrative  . No narrative on file    ROS:  As stated in the HPI and negative for all other systems.  PHYSICAL EXAM There were no vitals taken for this visit. GENERAL:  Well appearing HEENT:  Pupils equal round and reactive, fundi not visualized, oral mucosa unremarkable, upper dentures NECK:  No jugular venous distention, waveform within normal limits, carotid upstroke brisk and symmetric, no bruits, no thyromegaly LYMPHATICS:  No cervical, inguinal adenopathy LUNGS:  Clear to auscultation bilaterally BACK:  No CVA tenderness CHEST:  Unremarkable HEART:  PMI not displaced or sustained,S1 and S2 within normal limits, no S3, no S4, no clicks, no  rubs, no murmurs ABD:  Flat, positive bowel sounds normal in frequency in pitch, no bruits, no rebound, no guarding, no midline pulsatile mass, no hepatomegaly, no splenomegaly EXT:  2 plus pulses throughout, no edema, no cyanosis no clubbing SKIN:  No rashes no nodules NEURO:  Cranial nerves II through XII grossly intact, motor grossly intact throughout PSYCH:  Cognitively intact, oriented to person place and time  EKG:  Sinus rhythm, rate 75, axis within normal limits, intervals within normal limits, no acute ST-T wave changes.   ASSESSMENT AND PLAN

## 2011-04-10 NOTE — Assessment & Plan Note (Signed)
Given his symptoms and his known coronary disease he needs to be screened with a stress perfusion imaging. He wants to try to walk this but should be converted to a pharmacological stress testing if he cannot achieve a target heart rate.

## 2011-04-10 NOTE — Patient Instructions (Signed)
Please have blood work today before leaving. (BNP)  Start Atorvastatin 40 mg a day.  Rx was sent into your pharmacy. Continue all other medications as listed  Your physician has requested that you have a lexiscan myoview. For further information please visit [email or web address] Please follow instruction sheet, as given.  Your physician has recommended that you wear an event monitor for 21 days. Event monitors are medical devices that record the heart's electrical activity. Doctors most often [place] these monitors to diagnose arrhythmias. Arrhythmias are problems with the speed or rhythm of the heartbeat. The monitor is a small, portable device. You can wear one while you do your normal daily activities. This is usually used to diagnose what is causing palpitations/syncope (passing out).  Smoking Cessation, Tips for Success YOU CAN QUIT SMOKING If you are ready to quit smoking, congratulations! You have chosen to help yourself be healthier. Cigarettes bring nicotine, tar, carbon monoxide, and other irritants into your body. Your lungs, heart, and blood vessels will be able to work better without these poisons. There are many different ways to quit smoking. Nicotine gum, nicotine patches, a nicotine inhaler, or nicotine nasal spray can help with physical craving. Hypnosis, support groups, and medicines help break the habit of smoking. Here are some tips to help you quit for good.  Throw away all cigarettes.   Clean and remove all ashtrays from your home, work, and car.   On a card, write down your reasons for quitting. Carry the card with you and read it when you get the urge to smoke.   Cleanse your body of nicotine. Drink enough water and fluids to keep your urine clear or pale yellow. Do this after quitting to flush the nicotine from your body.   Learn to predict your moods. Do not let a bad situation be your excuse to have a cigarette. Some situations in your life might tempt you into wanting a  cigarette.   Never have "just one" cigarette. It leads to wanting another and another. Remind yourself of your decision to quit.   Change habits associated with smoking. If you smoked while driving or when feeling stressed, try other activities to replace smoking. Stand up when drinking your coffee. Brush your teeth after eating. Sit in a different chair when you read the paper. Avoid alcohol while trying to quit, and try to drink fewer caffeinated beverages. Alcohol and caffeine may urge you to smoke.   Avoid foods and drinks that can trigger a desire to smoke, such as sugary or spicy foods and alcohol.   Ask people who smoke not to smoke around you.   Have something planned to do right after eating or having a cup of coffee. Take a walk or exercise to perk you up. This will help to keep you from overeating.   Try a relaxation exercise to calm you down and decrease your stress. Remember, you may be tense and nervous for the first 2 weeks after you quit, but this will pass.   Find new activities to keep your hands busy. Play with a pen, coin, or rubber band. Doodle or draw things on paper.   Brush your teeth right after eating. This will help cut down on the craving for the taste of tobacco after meals. You can try mouthwash, too.   Use oral substitutes, such as lemon drops, carrots, a cinnamon stick, or chewing gum, in place of cigarettes. Keep them handy so they are available when you have the urge to  smoke.   When you have the urge to smoke, try deep breathing.   Designate your home as a nonsmoking area.   If you are a heavy smoker, ask your caregiver about a prescription for nicotine chewing gum. It can ease your withdrawal from nicotine.   Reward yourself. Set aside the cigarette money you save and buy yourself something nice.   Look for support from others. Join a support group or smoking cessation program. Ask someone at home or at work to help you with your plan to quit smoking.     Always ask yourself, "Do I need this cigarette or is this just a reflex?" Tell yourself, "Today, I choose not to smoke," or "I do not want to smoke." You are reminding yourself of your decision to quit, even if you do smoke a cigarette.  HOW WILL I FEEL WHEN I QUIT SMOKING?  The benefits of not smoking start within days of quitting.   You may have symptoms of withdrawal because your body is used to nicotine (the addictive substance in cigarettes). You may crave cigarettes, be irritable, feel very hungry, cough often, get headaches, or have difficulty concentrating.   The withdrawal symptoms are only temporary. They are strongest when you first quit but will go away within 10 to 14 days.   When withdrawal symptoms occur, stay in control. Think about your reasons for quitting. Remind yourself that these are signs that your body is healing and getting used to being without cigarettes.   Remember that withdrawal symptoms are easier to treat than the major diseases that smoking can cause.   Even after the withdrawal is over, expect periodic urges to smoke. However, these cravings are generally short-lived and will go away whether you smoke or not. Do not smoke!   If you relapse and smoke again, do not lose hope. Most smokers quit 3 times before they are successful.   If you relapse, do not give up! Plan ahead and think about what you will do the next time you get the urge to smoke.  LIFE AS A NONSMOKER: MAKE IT FOR A MONTH, MAKE IT FOR LIFE Day 1: Hang this page where you will see it every day. Day 2: Get rid of all ashtrays, matches, and lighters. Day 3: Drink water. Breathe deeply between sips. Day 4: Avoid places with smoke-filled air, such as bars, clubs, or the smoking section of restaurants. Day 5: Keep track of how much money you save by not smoking. Day 6: Avoid boredom. Keep a good book with you or go to the movies. Day 7: Reward yourself! One week without smoking! Day 8: Make a  dental appointment to get your teeth cleaned. Day 9: Decide how you will turn down a cigarette before it is offered to you. Day 10: Review your reasons for quitting. Day 11: Distract yourself. Stay active to keep your mind off smoking and to relieve tension. Take a walk, exercise, read a book, do a crossword puzzle, or try a new hobby. Day 12: Exercise. Get off the bus before your stop or use stairs instead of escalators. Day 13: Call on friends for support and encouragement. Day 14: Reward yourself! Two weeks without smoking! Day 15: Practice deep breathing exercises. Day 16: Bet a friend that you can stay a nonsmoker. Day 17: Ask to sit in nonsmoking sections of restaurants. Day 18: Hang up "No Smoking" signs. Day 19: Think of yourself as a nonsmoker. Day 20: Each morning, tell yourself you  will not smoke. Day 21: Reward yourself! Three weeks without smoking! Day 22: Think of smoking in negative ways. Remember how it stains your teeth, gives you bad breath, and leaves you short of breath. Day 23: Eat a nutritious breakfast. Day 24:Do not relive your days as a smoker. Day 25: Hold a pencil in your hand when talking on the telephone. Day 26: Tell all your friends you do not smoke. Day 27: Think about how much better food tastes. Day 28: Remember, one cigarette is one too many. Day 29: Take up a hobby that will keep your hands busy. Day 30: Congratulations! One month without smoking! Give yourself a big reward. Your caregiver can direct you to community resources or hospitals for support, which may include:  Group support.   Education.   Hypnosis.   Subliminal therapy.  Document Released: 12/07/2003 Document Revised: 11/20/2010 Document Reviewed: 12/25/2008 [facility] Patient Information 2012 [place], [place].

## 2011-04-10 NOTE — Assessment & Plan Note (Signed)
His direct LDL done yesterday was 152. I will start Lipitor 40 mg daily.

## 2011-04-10 NOTE — Assessment & Plan Note (Signed)
We had a long discussion about this. The patient understands the need to quit smoking. We discussed a specific strategy for tobacco cessation.  (Greater than three minutes discussing tobacco cessation.)

## 2011-04-11 NOTE — Telephone Encounter (Signed)
I spoke with Dr. Ninetta Lights, he will review the chart

## 2011-04-14 ENCOUNTER — Other Ambulatory Visit: Payer: Self-pay | Admitting: Internal Medicine

## 2011-04-14 DIAGNOSIS — N50819 Testicular pain, unspecified: Secondary | ICD-10-CM

## 2011-04-15 ENCOUNTER — Other Ambulatory Visit: Payer: Medicare PPO

## 2011-04-16 ENCOUNTER — Encounter: Payer: Self-pay | Admitting: Infectious Diseases

## 2011-04-16 ENCOUNTER — Ambulatory Visit (INDEPENDENT_AMBULATORY_CARE_PROVIDER_SITE_OTHER): Payer: Medicare PPO | Admitting: Infectious Diseases

## 2011-04-16 ENCOUNTER — Ambulatory Visit (HOSPITAL_COMMUNITY): Payer: Medicare PPO | Attending: Cardiology | Admitting: Radiology

## 2011-04-16 ENCOUNTER — Ambulatory Visit
Admission: RE | Admit: 2011-04-16 | Discharge: 2011-04-16 | Disposition: A | Discharge status: Home / Self Care | Payer: Medicare PPO | Source: Ambulatory Visit | Attending: Internal Medicine | Admitting: Internal Medicine

## 2011-04-16 ENCOUNTER — Encounter (INDEPENDENT_AMBULATORY_CARE_PROVIDER_SITE_OTHER): Payer: Medicare PPO

## 2011-04-16 VITALS — BP 125/76 | HR 76 | Temp 97.7°F | Ht 70.5 in | Wt 178.8 lb

## 2011-04-16 VITALS — BP 122/73 | Ht 70.0 in | Wt 178.0 lb

## 2011-04-16 DIAGNOSIS — I714 Abdominal aortic aneurysm, without rupture, unspecified: Secondary | ICD-10-CM | POA: Insufficient documentation

## 2011-04-16 DIAGNOSIS — R000 Tachycardia, unspecified: Secondary | ICD-10-CM | POA: Insufficient documentation

## 2011-04-16 DIAGNOSIS — N50819 Testicular pain, unspecified: Secondary | ICD-10-CM

## 2011-04-16 DIAGNOSIS — Z79899 Other long term (current) drug therapy: Secondary | ICD-10-CM

## 2011-04-16 DIAGNOSIS — R002 Palpitations: Secondary | ICD-10-CM | POA: Insufficient documentation

## 2011-04-16 DIAGNOSIS — I4949 Other premature depolarization: Secondary | ICD-10-CM

## 2011-04-16 DIAGNOSIS — I739 Peripheral vascular disease, unspecified: Secondary | ICD-10-CM | POA: Insufficient documentation

## 2011-04-16 DIAGNOSIS — E785 Hyperlipidemia, unspecified: Secondary | ICD-10-CM | POA: Insufficient documentation

## 2011-04-16 DIAGNOSIS — R5383 Other fatigue: Secondary | ICD-10-CM | POA: Insufficient documentation

## 2011-04-16 DIAGNOSIS — R591 Generalized enlarged lymph nodes: Secondary | ICD-10-CM

## 2011-04-16 DIAGNOSIS — R079 Chest pain, unspecified: Secondary | ICD-10-CM

## 2011-04-16 DIAGNOSIS — R42 Dizziness and giddiness: Secondary | ICD-10-CM | POA: Insufficient documentation

## 2011-04-16 DIAGNOSIS — F17200 Nicotine dependence, unspecified, uncomplicated: Secondary | ICD-10-CM | POA: Insufficient documentation

## 2011-04-16 DIAGNOSIS — I2510 Atherosclerotic heart disease of native coronary artery without angina pectoris: Secondary | ICD-10-CM

## 2011-04-16 DIAGNOSIS — I471 Supraventricular tachycardia: Secondary | ICD-10-CM

## 2011-04-16 DIAGNOSIS — R0602 Shortness of breath: Secondary | ICD-10-CM | POA: Insufficient documentation

## 2011-04-16 DIAGNOSIS — R599 Enlarged lymph nodes, unspecified: Secondary | ICD-10-CM

## 2011-04-16 DIAGNOSIS — R5381 Other malaise: Secondary | ICD-10-CM | POA: Insufficient documentation

## 2011-04-16 MED ORDER — TECHNETIUM TC 99M TETROFOSMIN IV KIT
30.0000 | PACK | Freq: Once | INTRAVENOUS | Status: AC | PRN
  Administered 2011-04-16: 30 via INTRAVENOUS

## 2011-04-16 MED ORDER — TECHNETIUM TC 99M TETROFOSMIN IV KIT
10.0000 | PACK | Freq: Once | INTRAVENOUS | Status: AC | PRN
  Administered 2011-04-16: 10 via INTRAVENOUS

## 2011-04-16 NOTE — Progress Notes (Signed)
  Subjective:    Patient ID: Dennis Combs, male    DOB: 08-28-1940, 71 y.o.   MRN: [id number]  HPI 71 yo M with hx CAD, increased lipids, and chills for several months. Has since developed night sweats. He had a LN behind his R ear. Took amoxicillin as well as 20 days of doxycycline.  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 States he was treated (with INH) and has since been declared negative. He has no cough but feels like he has pneumonia.  CXR 12-16-10: No active lung disease. Slight hyperaeration. Calcified  granulomas consistent with prior granulomatous disease.  CT temporal bones 01-10-11- negative.  He was seen in ID 01-17-11 and had ESR nl, TSH nl, AFB BCx ngtd, and a CT chest 1. No active cardiopulmonary abnormalities. 2. 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 He had a quantiferon gold done on 10-26 that was positive.  He has since been seen at the [facility] dept- has had multiple AFB sputums negative. He has continued to have fevers at home. Temp to 99-100 at home. Having chills "like crazy". Was being eval with heart monitor, stress test today. No more swollen LN.   C/O energy loss, dizziness, falling asleep easily.   Review of Systems  Constitutional: Negative for appetite change and unexpected weight change.  Respiratory: Negative for cough.   Gastrointestinal: Negative for diarrhea and constipation.  Genitourinary: Positive for testicular pain. Negative for dysuria.  Musculoskeletal: Positive for myalgias. Negative for joint swelling.       Objective:   Physical Exam  Constitutional: He appears well-developed and well-nourished.  HENT:  Head: Normocephalic.  Mouth/Throat: No oropharyngeal exudate.    Eyes: EOM are normal. Pupils are equal, round, and reactive to light.  Neck: Neck supple.  Cardiovascular: Normal rate, regular rhythm and normal heart sounds.   Pulmonary/Chest:  Effort normal and breath sounds normal.  Abdominal: Soft. Bowel sounds are normal.  Musculoskeletal: He exhibits no edema and no tenderness.  Lymphadenopathy:    He has cervical adenopathy.          Assessment & Plan:

## 2011-04-16 NOTE — Assessment & Plan Note (Addendum)
It is hard to call him FUO at this point- he has not had a documented fever here or at home. He has maintained his wt. Will recheck his BCx and BCx for AFB. Will check a testosterone on him as well as ESR, TSH and testosterone. He asks repeatedly about being screened for HIV, i told him that this is unlikely, but will screen him. Will have him seen by ENT for possible LN Bx, eval of his tonsilar abnormalities.  He has testicular pain and await this w/u. Will see him back in 1 month.

## 2011-04-16 NOTE — Progress Notes (Signed)
[facility] SITE 3 NUCLEAR MED [address] [place] [address] [phone]  Cardiology Nuclear Med Study  RUSTYN CONERY is a 71 y.o. male [id number] 1941-01-26   Nuclear Med Background Indication for Stress Test:  Evaluation for Ischemia History:  SVT;10/04 UXL:KGMWN inferior defects with peri-infarct reversibility, EF=55%>Cath:Totalled CFX with collaterals, 50% LAD; '11 GXT @ [facility]:VOZDGU; 12/12 Cardioversion Cardiac Risk Factors: Lipids, PVD-AAA (10/12 3.1 cm) and Smoker Symptoms:  Dizziness, DOE, Fatigue, Palpitations, Rapid HR and SOB   Nuclear Pre-Procedure Caffeine/Decaff Intake:  None NPO After: 6:30   Lungs:  Clear. IV 0.9% NS with Angio Cath:  20g  IV Site: R Hand  IV Started by:  Bonnita Levan, RN  Chest Size (in):  46 Cup Size: n/a  Height: 5\' 10"  (1.778 m)  Weight:  178 lb (80.74 kg)  BMI:  Body mass index is 25.54 kg/(m^2). Tech Comments:  No medications taken today, per patient.    Nuclear Med Study 1 or 2 day study: 1 day  Stress Test Type:  Stress  Reading MD: Marca Ancona, MD  Order Authorizing Provider:  Rollene Rotunda, MD  Resting Radionuclide: Technetium 3m Tetrofosmin  Resting Radionuclide Dose: 11.0 mCi   Stress Radionuclide:  Technetium 36m Tetrofosmin  Stress Radionuclide Dose: 33.0 mCi           Stress Protocol Rest HR: 71 Stress HR: 134  Rest BP: Sitting 122/73  Standing 95/71 Stress BP: 177/78  Exercise Time (min): 5:00 METS: 7.0   Predicted Max HR: 149 bpm % Max HR: 89.93 bpm Rate Pressure Product: 44034   Dose of Adenosine (mg):  n/a Dose of Lexiscan: 0.4 mg  Dose of Atropine (mg): n/a Dose of Dobutamine: n/a mcg/kg/min (at max HR)  Stress Test Technologist: Smiley Houseman, CMA-N  Nuclear Technologist:  Domenic Polite, CNMT     Rest Procedure:  Myocardial perfusion imaging was performed at rest 45 minutes following the intravenous administration of Technetium 41m Tetrofosmin.  Rest ECG: No Acute  changes.  Stress Procedure:  The patient exercised for five minutes on the treadmill utilizing the Bruce protocol.  The patient stopped due to fatigue and shortness of breath.  He denied any chest pain.  There were no diagnostic ST-T wave changes, only occasional PVC's and rare PAC's.  Technetium 69m Tetrofosmin was injected at peak exercise and myocardial perfusion imaging was performed after a brief delay.  Stress ECG: No significant change from baseline ECG  QPS Raw Data Images:  Normal; no motion artifact; normal heart/lung ratio. Stress Images:  Normal homogeneous uptake in all areas of the myocardium. Rest Images:  Normal homogeneous uptake in all areas of the myocardium. Subtraction (SDS):  There is no evidence of scar or ischemia. Transient Ischemic Dilatation (Normal <1.22):  0.97 Lung/Heart Ratio (Normal <0.45):  0.32  Quantitative Gated Spect Images QGS EDV:  81 ml QGS ESV:  29 ml QGS cine images:  NL LV Function; NL Wall Motion QGS EF: 64%  Impression Exercise Capacity:  Fair exercise capacity. BP Response:  Normal blood pressure response. Clinical Symptoms:  Shortness of breath and fatigue.  ECG Impression:  No significant ST segment change suggestive of ischemia. Comparison with Prior Nuclear Study: Prior study showed inferior infarct.   Overall Impression:  Normal stress nuclear study.  Prior study showed inferior infarct.   Aira Sallade [employer]

## 2011-04-17 LAB — TSH: TSH: 2.297 u[IU]/mL (ref 0.350–4.500)

## 2011-04-17 LAB — TESTOSTERONE: Testosterone: 259.71 ng/dL (ref 250–890)

## 2011-04-17 LAB — HIV ANTIBODY (ROUTINE TESTING W REFLEX): HIV: NONREACTIVE

## 2011-04-18 ENCOUNTER — Telehealth: Payer: Self-pay | Admitting: *Deleted

## 2011-04-18 NOTE — Telephone Encounter (Signed)
I have called all ENTs in [place] & [place]. None take [facility]. I spoke with Jennet Maduro, RN about this. I called & left a message with his wife to have him call me back. I would like him to look thru his insurance book or call them & find an ENT that takes his insurance.

## 2011-04-22 NOTE — Telephone Encounter (Signed)
I left him a message asking him to call his insurance company & find out which ENTs they approve since Ii have been unable find one

## 2011-04-24 ENCOUNTER — Telehealth: Payer: Self-pay | Admitting: *Deleted

## 2011-04-24 NOTE — Telephone Encounter (Signed)
LM on his cell, then called the house again. He answered. I explained the trouble I am having finding a ENT that will accept [employer]. I asked that he call his insurance company & find out who he can go to. Let me know & I will do the paperwork. He agreed & will call me tomorrow

## 2011-04-25 ENCOUNTER — Telehealth: Payer: Self-pay | Admitting: *Deleted

## 2011-04-25 NOTE — Telephone Encounter (Signed)
Received fax from [facility] Department stating they are closing his case due to negative TB cultures, asked that this clinic notify patient.  I notified Dennis Combs and he asked for his lab results.  The blood cultures are still pending, but I let him know all the labs that are back are normal. Dennis Combs CMA

## 2011-05-07 ENCOUNTER — Telehealth: Payer: Self-pay | Admitting: *Deleted

## 2011-05-07 NOTE — Telephone Encounter (Signed)
Spoke with [employer] [phone]) to find out what ENT providers are participating in their insurance program. I received a list of MD and ended up making an appointment with [facility] (([phone]; [address]  [place]) on Tuesday, June 17, 2011 @ 1:40pm. I called patient and relayed this information to him. Tacey Heap RN

## 2011-05-21 ENCOUNTER — Other Ambulatory Visit: Payer: Self-pay | Admitting: Internal Medicine

## 2011-05-21 NOTE — Telephone Encounter (Signed)
Refill done.  

## 2011-05-22 ENCOUNTER — Telehealth: Payer: Self-pay | Admitting: Cardiology

## 2011-05-22 NOTE — Telephone Encounter (Signed)
I spoke with the patient and made him aware of his results. 

## 2011-05-22 NOTE — Telephone Encounter (Signed)
New msg Pt wants to know heart monitor results please call

## 2011-05-22 NOTE — Telephone Encounter (Signed)
Dr Antoine Poche reviewed monitor done 04/16/11-05/06/11-- NSR with rare ectopy.   LMTCB for pt.

## 2011-05-30 LAB — AFB CULTURE, BLOOD

## 2011-06-27 ENCOUNTER — Encounter: Payer: Self-pay | Admitting: Gastroenterology

## 2011-07-07 ENCOUNTER — Encounter: Payer: Self-pay | Admitting: *Deleted

## 2011-07-08 ENCOUNTER — Ambulatory Visit: Payer: Medicare PPO | Admitting: Internal Medicine

## 2011-07-11 ENCOUNTER — Encounter: Payer: Self-pay | Admitting: Gastroenterology

## 2011-07-11 ENCOUNTER — Ambulatory Visit (INDEPENDENT_AMBULATORY_CARE_PROVIDER_SITE_OTHER): Payer: Medicare PPO | Admitting: Gastroenterology

## 2011-07-11 ENCOUNTER — Other Ambulatory Visit (INDEPENDENT_AMBULATORY_CARE_PROVIDER_SITE_OTHER): Payer: Medicare PPO

## 2011-07-11 ENCOUNTER — Telehealth: Payer: Self-pay | Admitting: *Deleted

## 2011-07-11 ENCOUNTER — Other Ambulatory Visit: Payer: Medicare PPO

## 2011-07-11 DIAGNOSIS — E538 Deficiency of other specified B group vitamins: Secondary | ICD-10-CM

## 2011-07-11 DIAGNOSIS — Z79899 Other long term (current) drug therapy: Secondary | ICD-10-CM

## 2011-07-11 DIAGNOSIS — R7301 Impaired fasting glucose: Secondary | ICD-10-CM

## 2011-07-11 DIAGNOSIS — R509 Fever, unspecified: Secondary | ICD-10-CM

## 2011-07-11 DIAGNOSIS — R599 Enlarged lymph nodes, unspecified: Secondary | ICD-10-CM

## 2011-07-11 DIAGNOSIS — R7611 Nonspecific reaction to tuberculin skin test without active tuberculosis: Secondary | ICD-10-CM

## 2011-07-11 DIAGNOSIS — Z227 Latent tuberculosis: Secondary | ICD-10-CM

## 2011-07-11 DIAGNOSIS — K219 Gastro-esophageal reflux disease without esophagitis: Secondary | ICD-10-CM

## 2011-07-11 DIAGNOSIS — K5900 Constipation, unspecified: Secondary | ICD-10-CM

## 2011-07-11 DIAGNOSIS — R634 Abnormal weight loss: Secondary | ICD-10-CM

## 2011-07-11 DIAGNOSIS — Z8711 Personal history of peptic ulcer disease: Secondary | ICD-10-CM

## 2011-07-11 DIAGNOSIS — Z86010 Personal history of colonic polyps: Secondary | ICD-10-CM

## 2011-07-11 DIAGNOSIS — Z9889 Other specified postprocedural states: Secondary | ICD-10-CM

## 2011-07-11 DIAGNOSIS — E785 Hyperlipidemia, unspecified: Secondary | ICD-10-CM

## 2011-07-11 DIAGNOSIS — R7989 Other specified abnormal findings of blood chemistry: Secondary | ICD-10-CM

## 2011-07-11 LAB — FOLATE: Folate: 14.7 ng/mL (ref >5.9)

## 2011-07-11 LAB — LIPASE: Lipase: 20 U/L (ref 11.0–59.0)

## 2011-07-11 LAB — IBC PANEL
Saturation Ratios: 29.4 % (ref 20.0–50.0)
Transferrin: 301.6 mg/dL (ref 212.0–360.0)

## 2011-07-11 LAB — VITAMIN B12: Vitamin B-12: 180 pg/mL — ABNORMAL LOW (ref 211–911)

## 2011-07-11 MED ORDER — CYANOCOBALAMIN 1000 MCG/ML IJ SOLN: INTRAMUSCULAR | Status: DC

## 2011-07-11 NOTE — Progress Notes (Addendum)
History of Present Illness:  This is a 71 year old Caucasian male heavy smoker with COPD and previous treatment for tuberculosis who presents with complaints of relapsing fever, skin rashes, general malaise and fatigue. He relates that I am the" 11th Dr. I have seen" INCLUDING multiple physicians at the [facility]. Apparently he has also had a negative infectious disease workup at [facility]. He really denies any GI complaints, but has had mild anorexia and a 10 pound weight loss. He specifically denies acid reflux, dysphagia, any hepatobiliary complaints, but does have mild constipation without rectal bleeding. Last colonoscopy was in 2004 with removal of hyperplastic polyps, and rather severe diverticulosis. Patient's mother does have colon polyps and apparently ulcerative colitis. He has not had any rectal bleeding, melena, hematochezia or history of hepatitis or pancreatitis. 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 He has had previous vagotomy, PYLOROPLASTY, and oversew of a gastric ulcer 15 years ago. He denies dyspeptic complaints at this time, for use of NSAIDs.  I have reviewed this patient's present history, medical and surgical past history, allergies and medications.     ROS: The remainder of the 10 point ROS is negative... he does complain of fatigue, shortness of breath with exertion, periodic itchy skin rashes, chronic depression, chronic insomnia, night sweats, muscle cramps, and recurrent low-grade fevers. He denies cough, sputum production, hemoptysis, rather cardiopulmonary problems. Review of his labs shows no specific abnormalities otherwise with a normal sed rate and negative ANA.  Allergies  Allergen Reactions  . Codeine     REACTION: whelps,rash,itching  . Ivp Dye (Iodinated Diagnostic Agents) Hives and Itching  . Metronidazole     REACTION: n,v,d   Outpatient Prescriptions Prior to Visit  Medication Sig Dispense Refill  . nitroGLYCERIN  (NITROSTAT) 0.4 MG SL tablet Place 0.4 mg under the tongue every 5 (five) minutes as needed.        . verapamil (VERELAN PM) 180 MG 24 hr capsule TAKE ONE CAPSULE BY MOUTH DAILY  30 capsule  6  . vitamin C (ASCORBIC ACID) 250 MG tablet Take 1,000 mg by mouth daily.        Marland Kitchen atorvastatin (LIPITOR) 40 MG tablet Take 1 tablet (40 mg total) by mouth daily.  30 tablet  11  . LORazepam (ATIVAN) 0.5 MG tablet Take 0.5 mg by mouth every 8 (eight) hours.      Marland Kitchen zolpidem (AMBIEN) 5 MG tablet Take 2.5 mg by mouth at bedtime as needed.         Past Medical History  Diagnosis Date  . Anxiety   . CAD (coronary artery disease)     last catheterization 2004, medical management.  Marland Kitchen GERD (gastroesophageal reflux disease)   . PUD (peptic ulcer disease)     History of  . AAA (abdominal aortic aneurysm)     dx 2007   . Hyperglycemia   . Hyperlipidemia   . SVT (supraventricular tachycardia)   . Tobacco abuse   . Enlargement of lymph nodes   . Granulomatous lung disease   . Hiatal hernia   . Diverticulosis of colon (without mention of hemorrhage)   . Unspecified hemorrhoids without mention of complication   . Personal history of colonic polyps 01/18/2003 & 01/14/1999    hyperplastic polyps (2004) & Tubular adenoma(2000)  . Gastroparesis   . Anal fissure   . Cardiac arrhythmia   . History of pneumonia    Past Surgical History  Procedure Date  .  Appendectomy   . Tonsillectomy   . Abdominal surgery     Vagotomy  and pyloroplasty due to bleeding ulcer, 1999   History   Social History  . Marital Status: Married    Spouse Name: N/A    Number of Children: 4  . Years of Education: N/A   Occupational History  . retired    Social History Main Topics  . Smoking status: Current Everyday Smoker -- 1.0 packs/day for 50 years    Types: Cigarettes  . Smokeless tobacco: Never Used  . Alcohol Use: No  . Drug Use: No  . Sexually Active: None   Other Topics Concern  . None   Social History  Narrative  . None   Family History  Problem Relation Age of Onset  . Diabetes Mother   . Prostate cancer Father   . Colon polyps Mother         Physical Exam: Blood pressure 110/60, pulse 68 and regular, and weight 174 pounds with BMI of 24.63. General well developed well nourished patient in no acute distress, appearing his stated age Eyes PERRLA, no icterus, fundoscopic exam per opthamologist Skin no lesions noted Neck supple, no adenopathy, no thyroid enlargement, no tenderness Chest clear to percussion and auscultation Heart no significant murmurs, gallops or rubs noted Abdomen no hepatosplenomegaly masses or tenderness, BS normal.  Extremities no acute joint lesions, edema, phlebitis or evidence of cellulitis. Neurologic patient oriented x 3, cranial nerves intact, no focal neurologic deficits noted. Psychological mental status normal and normal affect.  Assessment and plan: FUO of unexplained etiology. He does need followup colonoscopy which has been scheduled, and with his history of previous peptic ulcer disease, we will proceed with endoscopy and evaluation for H. pylori, celiac disease, or Whipple's disease with small bowel biopsies. The patient has no symptomatology of chronic hepatic insufficiency. Review of his CT scan does show a single solitary gallstone, but otherwise no specific gastrointestinal abnormalities. Recent extensive cardiac workup has been negative. Gastric emptying scan in the past was normal. He does not have symptomatology suggestive of dumping syndrome. The patient and his wife, a retired Charity fundraiser, are concerned about the possibility of occult tuberculosis. Chest CT is shown old granulomatous disease but no evidence of active tuberculosis. Pelvic deferred to infectious disease, but he may need spine and long bone radiographs to complete his workup.  Encounter Diagnoses  Name Primary?  . Constipation Yes  . Esophageal reflux

## 2011-07-11 NOTE — Telephone Encounter (Signed)
Informed pt of need for lab and need for B12 inj. Wife is a former Charity fundraiser and will give the injection.

## 2011-07-11 NOTE — Patient Instructions (Addendum)
You have been given a separate informational sheet regarding your tobacco use, the importance of quitting and local resources to help you quit. Your procedure has been scheduled for 07/18/2011, please follow the seperate instructions.  Please go to the basement today for your labs.

## 2011-07-11 NOTE — Telephone Encounter (Signed)
Message copied by Florene Glen on Fri Jul 11, 2011  4:54 PM ------      Message from: PATTERSON, DAVID R      Created: Fri Jul 11, 2011  3:52 PM       Stop vitamin C, begin B12 shots, and check hemachromatosis genetic.

## 2011-07-14 ENCOUNTER — Telehealth: Payer: Self-pay | Admitting: Gastroenterology

## 2011-07-14 LAB — CELIAC PANEL 10
Gliadin IgA: 6 U/mL (ref <20)
Gliadin IgG: 8.4 U/mL (ref <20)
IgA: 191 mg/dL (ref 68–379)
Tissue Transglut Ab: 8 U/mL (ref <20)

## 2011-07-14 MED ORDER — MOVIPREP 100 G PO SOLR: 1.0000 | Freq: Once | ORAL | Status: DC

## 2011-07-14 NOTE — Telephone Encounter (Signed)
Prep sent to pharmacy, pt aware

## 2011-07-18 ENCOUNTER — Ambulatory Visit (AMBULATORY_SURGERY_CENTER): Payer: Medicare PPO | Admitting: Gastroenterology

## 2011-07-18 ENCOUNTER — Encounter: Payer: Self-pay | Admitting: Gastroenterology

## 2011-07-18 VITALS — BP 126/64 | HR 76 | Temp 98.6°F | Resp 20 | Ht 70.0 in | Wt 174.0 lb

## 2011-07-18 DIAGNOSIS — K2281 Esophageal polyp: Secondary | ICD-10-CM

## 2011-07-18 DIAGNOSIS — K228 Other specified diseases of esophagus: Secondary | ICD-10-CM

## 2011-07-18 DIAGNOSIS — K5900 Constipation, unspecified: Secondary | ICD-10-CM

## 2011-07-18 DIAGNOSIS — D131 Benign neoplasm of stomach: Secondary | ICD-10-CM

## 2011-07-18 DIAGNOSIS — Z1211 Encounter for screening for malignant neoplasm of colon: Secondary | ICD-10-CM

## 2011-07-18 DIAGNOSIS — K2940 Chronic atrophic gastritis without bleeding: Secondary | ICD-10-CM

## 2011-07-18 DIAGNOSIS — K2950 Unspecified chronic gastritis without bleeding: Secondary | ICD-10-CM

## 2011-07-18 DIAGNOSIS — R509 Fever, unspecified: Secondary | ICD-10-CM

## 2011-07-18 DIAGNOSIS — D1330 Benign neoplasm of unspecified part of small intestine: Secondary | ICD-10-CM

## 2011-07-18 DIAGNOSIS — Z86010 Personal history of colonic polyps: Secondary | ICD-10-CM

## 2011-07-18 MED ORDER — SODIUM CHLORIDE 0.9 % IV SOLN: 500.0000 mL | INTRAVENOUS | Status: DC

## 2011-07-18 NOTE — Patient Instructions (Addendum)

## 2011-07-18 NOTE — Op Note (Signed)
[facility] [address] [place], [place]  [address]  ENDOSCOPY PROCEDURE REPORT  PATIENT:  Dennis, Combs  MR#:  [id number] BIRTHDATE:  Jun 20, 1940, 70 yrs. old  GENDER:  male  ENDOSCOPIST:  Vania Rea. Jarold Motto, MD, [facility] Referred by:  Willow Ora, M.D.  PROCEDURE DATE:  07/18/2011 PROCEDURE:  EGD with biopsy, 43239, EGD with biopsy for H. pylori 19147 ASA CLASS:  Class II INDICATIONS:  FUO,WEIGHT LOSS  MEDICATIONS:   There was residual sedation effect present from prior procedure., propofol (Diprivan) 80 mg IV TOPICAL ANESTHETIC:  DESCRIPTION OF PROCEDURE:   After the risks and benefits of the procedure were explained, informed consent was obtained.  The LB GIF-H180 [id number] endoscope was introduced through the mouth and advanced to the third portion of the duodenum.  The instrument was slowly withdrawn as the mucosa was fully examined. <<PROCEDUREIMAGES>>  Moderate gastritis was found in the body and the antrum of the stomach. CLO AND REGULAR BIOPSIES DONE.  Normal duodenal folds were noted. SI BIOPSIES DONE.  A sessile polyp was found at the gastroesophageal junction. BIOPSIES OF SOFT POLYP AT GE JUNCTION AREA.SEE PICTURES.NO ULCERATION OR OBSTRUCTION.    POLYP AT CARDIA/ESOPHAGEAL AREA.  The scope was then withdrawn from the patient and the procedure completed.  COMPLICATIONS:  None  ENDOSCOPIC IMPRESSION: 1) Moderate gastritis in the body and the antrum of the stomach  2) Normal duodenal folds 3) Sessile polyp at the gastroesophageal junction 1.R/O WHIPPLES'DISEASE 2.R/O H.PYLORI GASTRITIS 3.R/O ATYPIA IN POLYP RECOMMENDATIONS: 1) Await pathology results 2) Rx CLO if positive 3) continue current medications  ______________________________ Vania Rea. Jarold Motto, MD, [facility]  CC:  n. eSIGNED:   Vania Rea. Aurelio Mccamy at 07/18/2011 03:51 PM  Leonor Liv, [phone]

## 2011-07-18 NOTE — Progress Notes (Signed)
Patient did not have preoperative order for IV antibiotic SSI prophylaxis. (G8918)  Patient did not experience any of the following events: a burn prior to discharge; a fall within the facility; wrong site/side/patient/procedure/implant event; or a hospital transfer or hospital admission upon discharge from the facility. (G8907)  

## 2011-07-18 NOTE — Op Note (Signed)
[facility] [address] [place], [place]  [address]  COLONOSCOPY PROCEDURE REPORT  PATIENT:  Dennis Combs, Dennis Combs  MR#:  [id number] BIRTHDATE:  July 22, 1940, 70 yrs. old  GENDER:  male ENDOSCOPIST:  Vania Rea. Jarold Motto, MD, [facility] REF. BY:  Willow Ora, M.D. PROCEDURE DATE:  07/18/2011 PROCEDURE:  Diagnostic Colonoscopy ASA CLASS:  Class II INDICATIONS:  history of hyperplastic polyps FUO MEDICATIONS:   propofol (Diprivan) 200 mg IV  DESCRIPTION OF PROCEDURE:   After the risks and benefits and of the procedure were explained, informed consent was obtained. Digital rectal exam was performed and revealed no abnormalities. The LB CF-H180AL [id number] endoscope was introduced through the anus and advanced to the cecum, which was identified by both the appendix and ileocecal valve.  The quality of the prep was excellent, using MoviPrep.  The instrument was then slowly withdrawn as the colon was fully examined. <<PROCEDUREIMAGES>>  FINDINGS:  Severe diverticulosis was found in the sigmoid to descending colon segments.  There were mild diverticular changes in left colon. diverticulosis was found. TIGHT SIGMOID AREA.  No polyps or cancers were seen.  This was otherwise a normal examination of the colon.   Retroflexed views in the rectum revealed no abnormalities.    The scope was then withdrawn from the patient and the procedure completed.  COMPLICATIONS:  None ENDOSCOPIC IMPRESSION: 1) Severe diverticulosis in the sigmoid to descending colon segments 2) No polyps or cancers 3) Otherwise normal examination RECOMMENDATIONS: 1) High fiber diet. 2) Repeat Colonoscopy in 5 years. 3) metamucil or benefiber  REPEAT EXAM:  No  ______________________________ Vania Rea. Jarold Motto, MD, [facility]  CC:  n. eSIGNED:   Vania Rea. Livio Ledwith at 07/18/2011 03:43 PM  Leonor Liv, [phone]

## 2011-07-21 ENCOUNTER — Telehealth: Payer: Self-pay | Admitting: *Deleted

## 2011-07-21 LAB — HELICOBACTER PYLORI SCREEN-BIOPSY: UREASE: NEGATIVE

## 2011-07-21 NOTE — Telephone Encounter (Signed)
  Follow up Call-  Call back number 07/18/2011  Post procedure Call Back phone  # [phone]  Permission to leave phone message Yes     Patient questions:  Do you have a fever, pain , or abdominal swelling? no Pain Score  0 *  Have you tolerated food without any problems? no  Have you been able to return to your normal activities? yes  Do you have any questions about your discharge instructions: Diet   no Medications  no Follow up visit  no  Do you have questions or concerns about your Care? no  Actions: * If pain score is 4 or above: No action needed, pain <4. Pt. Stated that he did fine Friday, Saturday, but Sunday he got sick on the stomach. Pt. Stated that every now and then he gets sick on the stomach like that. Pt denies any nausea this morning.pt. Also stated it's noticeable when he swallows stated it does not hurt,but is just more noticeable. Instructed pt. To eat today and let us know if he has anymore problems,pt verbalize understanding.

## 2011-07-22 ENCOUNTER — Encounter: Payer: Self-pay | Admitting: Gastroenterology

## 2011-07-23 ENCOUNTER — Telehealth: Payer: Self-pay | Admitting: *Deleted

## 2011-07-23 MED ORDER — OMEPRAZOLE 20 MG PO CPDR: 20.0000 mg | DELAYED_RELEASE_CAPSULE | Freq: Every day | ORAL | Status: DC

## 2011-07-23 NOTE — Telephone Encounter (Signed)
Tried to notify patient of new prescription (prilosec) being sent to pharmacy today. Left message on home phone (no answer on cell #) Advised patient to start medication as soon as convenient and told to expect results letter in mail soon. Advised to call back if he has any questions.

## 2011-08-08 ENCOUNTER — Telehealth: Payer: Self-pay | Admitting: Cardiology

## 2011-08-08 NOTE — Telephone Encounter (Signed)
Patient called wanted Dr.Hochrein to know he is going to [place] today 08/08/11 to get patches to help him quit smoking.

## 2011-08-08 NOTE — Telephone Encounter (Signed)
New msg Pt wants to talk to you about going to [place] today to get patches to quit smoking. He wants to know if ok to take these. Please call

## 2011-08-25 ENCOUNTER — Telehealth: Payer: Self-pay | Admitting: Gastroenterology

## 2011-08-25 NOTE — Telephone Encounter (Signed)
GI W/U NEGATIVE,HAS CHRONIC COMPLAINTS.Marland KitchenPRIMARY CARE F/U,MOSTLY FUNCTIONAL PROBLEMS

## 2011-08-25 NOTE — Telephone Encounter (Signed)
Pt had ECL on 07/18/11 that didn't really show anything. He was instructed to take stool softeners daily for constipation and prilosec. Pt reports after the dinner meal, he gets nauseated and then get diarrhea. He stopped the stool softener about 8 days ago w/o help and then stopped the prilosec Saturday and it hasn't helped. Please advise. Thanks.

## 2011-08-26 NOTE — Telephone Encounter (Signed)
Informed pt his GI work up was negative and there is nothing else we can offer; Dr Jarold Motto feels he mostly has functional problems. He may f/u with his PCP; pt stated understanding.

## 2011-09-01 ENCOUNTER — Telehealth: Payer: Self-pay | Admitting: Internal Medicine

## 2011-09-01 NOTE — Telephone Encounter (Signed)
Patient called in today and stated that in order to get his Retirement apartment to accept his dog. He needs to know if Dr.Paz will fill out forms stating that his pet dog comforts his SVT condition. Patient stated once he get ok from paz he can get [employer] to fax all paperwork for him to pull out.  Patient ph# [phone]

## 2011-10-20 ENCOUNTER — Encounter: Payer: Self-pay | Admitting: *Deleted

## 2011-10-20 ENCOUNTER — Telehealth: Payer: Self-pay | Admitting: Internal Medicine

## 2011-10-20 NOTE — Telephone Encounter (Signed)
pt requesting a letter be mailed to him that states paz is his PCP, can call back at [phone]

## 2011-10-20 NOTE — Telephone Encounter (Signed)
Mailed letter °

## 2012-01-20 ENCOUNTER — Other Ambulatory Visit: Payer: Self-pay | Admitting: Internal Medicine

## 2012-01-20 NOTE — Telephone Encounter (Signed)
Refill done. Pt needs an OV for future refills.  

## 2012-02-06 ENCOUNTER — Encounter (INDEPENDENT_AMBULATORY_CARE_PROVIDER_SITE_OTHER): Payer: Self-pay | Admitting: General Surgery

## 2012-02-06 ENCOUNTER — Ambulatory Visit (INDEPENDENT_AMBULATORY_CARE_PROVIDER_SITE_OTHER): Payer: Medicare HMO | Admitting: General Surgery

## 2012-02-06 VITALS — BP 112/70 | HR 72 | Temp 98.6°F | Resp 16 | Ht 70.5 in | Wt 171.4 lb

## 2012-02-06 DIAGNOSIS — K611 Rectal abscess: Secondary | ICD-10-CM

## 2012-02-06 DIAGNOSIS — K612 Anorectal abscess: Secondary | ICD-10-CM

## 2012-02-06 HISTORY — DX: Rectal abscess: K61.1

## 2012-02-06 NOTE — Progress Notes (Signed)
Subjective:     Patient ID: Dennis Combs, male   DOB: Dec 27, 1940, 71 y.o.   MRN: [id number]  HPI This patient presents today for evaluation of possible perirectal abscess. He has a history of 2 prior incision and drainages and says that this is similar to his other episodes. He has been feeling okay until 2 days ago he he began having pain in the area and it has been increasing over the last 2 days. He denies any fevers or chills and has not been on any antibiotics. He has had some diarrhea but otherwise no new issues.  He also has had some difficulty voiding this afternoon  Review of Systems     Objective:   Physical Exam He has a bruise over his right eye but no obvious laceration in the orbit appears normal without any tenderness He has some induration on the left buttock region and some tenderness in the area of consistent with a recurrent pararectal abscess. I placed a high-frequency ultrasound probe and he appeared to have some fluid in the area of the induration. I accessed the fluid collection with an 18-gauge needle in aspirated 12 cc of purulent material and cultures were taken. I anesthetized the area of with 10 cc of 1% lidocaine with epinephrine after the area was prepped and consent was obtained. I made an small incision in the area of where I aspirated the purulent material and just a small amount of purulent material remained. I stuffed the cavity with a 2 x 2 gauze. The procedure was well-tolerated and there no apparent complications.    Assessment:     Pararectal abscess He did have a significant period material in the area and hopefully after incision and drainage this will be adequate treatment. His wife is a Engineer, civil (consulting) and she agreed to remove the gauze tomorrow and he should not need any further packing of the area. I prescribed Augmentin 875 mg twice a day as well as some Percocet for some discomfort and recommended that he come to the emergency room over the weekend if he  does not continue to improve each day. We will set him up for followup appointment on Monday to repeat his exam and to ensure that this is adequate treatment. His blood pressure was initially low but repeat of the blood pressure was 112/70 and I recommend that he also follow up with his primary physician with regard to this. I recommended that he go to the emergency room if he is unable to urinate in the next few hours.    Plan:      status post incision and drainage Augmentin 875 twice a day Follow up on Monday or sooner in the emergency room if this does not continue to improve now that he has had drainage.

## 2012-02-06 NOTE — Progress Notes (Signed)
Recheck patient blood pressure 112/70

## 2012-02-09 ENCOUNTER — Encounter (INDEPENDENT_AMBULATORY_CARE_PROVIDER_SITE_OTHER): Payer: Self-pay | Admitting: General Surgery

## 2012-02-09 ENCOUNTER — Ambulatory Visit (INDEPENDENT_AMBULATORY_CARE_PROVIDER_SITE_OTHER): Payer: Medicare HMO | Admitting: General Surgery

## 2012-02-09 VITALS — BP 122/80 | HR 81 | Temp 97.1°F | Resp 16 | Ht 70.5 in | Wt 170.6 lb

## 2012-02-09 DIAGNOSIS — Z09 Encounter for follow-up examination after completed treatment for conditions other than malignant neoplasm: Secondary | ICD-10-CM

## 2012-02-09 LAB — WOUND CULTURE: Gram Stain: NONE SEEN

## 2012-02-09 NOTE — Progress Notes (Signed)
History: Patient returns for an early postop check after aspiration and drainage of a peri-anal or perirectal abscess by Dr. Biagio Quint 3 days ago. He states he had a lot of drainage with sitz baths the first couple of days and it is feeling a lot better.  Exam: BP 122/80  Pulse 81  Temp 97.1 F (36.2 C) (Temporal)  Resp 16  Ht 5' 10.5" (1.791 m)  Wt 170 lb 9.6 oz (77.384 kg)  BMI 24.13 kg/m2  SpO2 97% General: Elderly Caucasian male who appears in no distress Rectal: PIN D. Site is clean with minimal induration and is open with still some slight drainage. No erythema. No significant tenderness. Rectal exam shows no mass or induration above the dentate line.  Assessment and plan: Status post incision and drainage of perirectal abscess which appears to be resolving appropriately. He continue sitz baths and antibiotics until they're completed. Call for any worsening symptoms otherwise return in 3 weeks.

## 2012-02-23 ENCOUNTER — Other Ambulatory Visit: Payer: Self-pay | Admitting: Internal Medicine

## 2012-02-24 NOTE — Telephone Encounter (Signed)
Pt has not been seen within the past year. OK to refill?

## 2012-02-24 NOTE — Telephone Encounter (Addendum)
Refill request was received yesterday, patient complained that her request has been ignored and stated -- according to my staff-- that "if I have to heart issue because I don't get my medications it will be  Dr. Drue Novel fault": Last OV > 1 year, okay one-month supply, no RF, arrange ROV within a month. Please remind patient that prescriptions for routine medicines takes 24-48 hours

## 2012-02-24 NOTE — Telephone Encounter (Signed)
Discussed with pt. Schedule appt 1.2.13 @8am . Refill done.

## 2012-03-11 ENCOUNTER — Encounter (INDEPENDENT_AMBULATORY_CARE_PROVIDER_SITE_OTHER): Payer: Medicare HMO | Admitting: General Surgery

## 2012-03-25 ENCOUNTER — Encounter: Payer: Self-pay | Admitting: Internal Medicine

## 2012-03-25 ENCOUNTER — Ambulatory Visit (INDEPENDENT_AMBULATORY_CARE_PROVIDER_SITE_OTHER): Payer: Medicare PPO | Admitting: Internal Medicine

## 2012-03-25 VITALS — BP 118/78 | HR 91 | Temp 98.0°F | Wt 171.0 lb

## 2012-03-25 DIAGNOSIS — R591 Generalized enlarged lymph nodes: Secondary | ICD-10-CM

## 2012-03-25 DIAGNOSIS — R599 Enlarged lymph nodes, unspecified: Secondary | ICD-10-CM

## 2012-03-25 DIAGNOSIS — E785 Hyperlipidemia, unspecified: Secondary | ICD-10-CM

## 2012-03-25 DIAGNOSIS — R6883 Chills (without fever): Secondary | ICD-10-CM

## 2012-03-25 DIAGNOSIS — E538 Deficiency of other specified B group vitamins: Secondary | ICD-10-CM

## 2012-03-25 DIAGNOSIS — Z720 Tobacco use: Secondary | ICD-10-CM

## 2012-03-25 DIAGNOSIS — Z8679 Personal history of other diseases of the circulatory system: Secondary | ICD-10-CM

## 2012-03-25 LAB — CBC WITH DIFFERENTIAL/PLATELET
Basophils Relative: 0.6 % (ref 0.0–3.0)
Eosinophils Absolute: 0.2 10*3/uL (ref 0.0–0.7)
HCT: 43.9 % (ref 39.0–52.0)
Hemoglobin: 14.9 g/dL (ref 13.0–17.0)
MCHC: 33.8 g/dL (ref 30.0–36.0)
MCV: 87.4 fl (ref 78.0–100.0)
Monocytes Absolute: 0.5 10*3/uL (ref 0.1–1.0)
Neutro Abs: 5.1 10*3/uL (ref 1.4–7.7)
RBC: 5.02 Mil/uL (ref 4.22–5.81)

## 2012-03-25 LAB — COMPREHENSIVE METABOLIC PANEL
AST: 14 U/L (ref 0–37)
Alkaline Phosphatase: 52 U/L (ref 39–117)
BUN: 9 mg/dL (ref 6–23)
Creatinine, Ser: 0.7 mg/dL (ref 0.4–1.5)
Potassium: 3.8 mEq/L (ref 3.5–5.1)
Total Bilirubin: 0.9 mg/dL (ref 0.3–1.2)

## 2012-03-25 LAB — LIPID PANEL
LDL Cholesterol: 114 mg/dL — ABNORMAL HIGH (ref 0–99)
Total CHOL/HDL Ratio: 4

## 2012-03-25 LAB — VITAMIN B12: Vitamin B-12: 333 pg/mL (ref 211–911)

## 2012-03-25 MED ORDER — NITROGLYCERIN 0.4 MG SL SUBL: 0.4000 mg | SUBLINGUAL_TABLET | SUBLINGUAL | Status: DC | PRN

## 2012-03-25 MED ORDER — VERAPAMIL HCL ER 180 MG PO CP24: 180.0000 mg | ORAL_CAPSULE | Freq: Every day | ORAL | Status: DC

## 2012-03-25 NOTE — Assessment & Plan Note (Signed)
W/u was done per ID, + interferon Gamma, was referred to the health department, they close the case due to (-) TB cultures. At this point, he still has chills, weight stable x last 2-3 months. Plan: Reassess in 4  months

## 2012-03-25 NOTE — Assessment & Plan Note (Signed)
Quit 06-2011, praised!

## 2012-03-25 NOTE — Assessment & Plan Note (Signed)
Not taking any medication at this point, labs

## 2012-03-25 NOTE — Progress Notes (Signed)
  Subjective:    Patient ID: Dennis Combs, male    DOB: 1940/04/25, 72 y.o.   MRN: [id number]  HPI Routine office visit Chills, status post eval by ID several months ago, he still has occasional chills, there is a has subjective fever. High cholesterol, not taking medication SVT, had 2 episodes, see assessment and plan. B12 deficiency, diagnosed last year, good medication compliance.    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 Cath 2004 for CP:   "The patient has total occlusion of the dominant circumflex which is stable compared with August 2001.  There is no change in his coronary anatomy to explain the change in his symptoms.  Will add nitrates for control of his angina" Peri-rectal abscess 2013   Family History: Prostate Ca - DAD colon ca--no DM - M MI-- no  Social History: Married, 4 children, 7 G children tobacco--  > 1PPD  ETOH-- quit 06-2011  still very active      Review of Systems No chest pain or shortness of breath No nausea vomiting , occasional he has diarrhea, thinks related to diet. Used to drink daily, completely quit 06-2011    Objective:   Physical Exam  General -- alert, well-developed, and well-nourished.   Neck --  no LADs Lungs -- normal respiratory effort, no intercostal retractions, no accessory muscle use, and normal breath sounds.   Heart-- normal rate, regular rhythm, no murmur, and no gallop.   Abdomen--soft, non-tender, no distention, no masses, no HSM, no guarding, and no rigidity.   Extremities-- no pretibial edema bilaterally  Neurologic-- alert & oriented X3 and strength normal in  all extremities. Psych-- Cognition and judgment appear intact. Alert and cooperative with normal attention span and concentration.  not anxious appearing and not depressed appearing.      Assessment & Plan:

## 2012-03-25 NOTE — Assessment & Plan Note (Signed)
Diagnosis last year by GI, good compliance with shots. Labs

## 2012-03-25 NOTE — Assessment & Plan Note (Signed)
Saw ENT, at the time LAD was gone, self exam remains (-), still has chills

## 2012-03-25 NOTE — Assessment & Plan Note (Signed)
Reports 2 episodes in the last few months, one required an ER visit [facility]), the other responded to carotid massage

## 2012-03-29 ENCOUNTER — Encounter: Payer: Self-pay | Admitting: *Deleted

## 2012-03-29 MED ORDER — FOLIC ACID 1 MG PO TABS: 1.0000 mg | ORAL_TABLET | Freq: Every day | ORAL | Status: DC

## 2012-03-29 NOTE — Addendum Note (Signed)
Addended by: Edwena Felty T on: 03/29/2012 10:07 AM   Modules accepted: Orders

## 2012-04-27 ENCOUNTER — Emergency Department (HOSPITAL_COMMUNITY)
Admission: EM | Admit: 2012-04-27 | Discharge: 2012-04-27 | Disposition: A | Discharge status: Home / Self Care | Payer: Medicare PPO | Attending: Emergency Medicine | Admitting: Emergency Medicine

## 2012-04-27 ENCOUNTER — Encounter (HOSPITAL_COMMUNITY): Payer: Self-pay | Admitting: Emergency Medicine

## 2012-04-27 ENCOUNTER — Encounter: Payer: Self-pay | Admitting: Cardiology

## 2012-04-27 DIAGNOSIS — Z862 Personal history of diseases of the blood and blood-forming organs and certain disorders involving the immune mechanism: Secondary | ICD-10-CM | POA: Insufficient documentation

## 2012-04-27 DIAGNOSIS — Z860100 Personal history of colon polyps, unspecified: Secondary | ICD-10-CM | POA: Insufficient documentation

## 2012-04-27 DIAGNOSIS — Z8659 Personal history of other mental and behavioral disorders: Secondary | ICD-10-CM | POA: Insufficient documentation

## 2012-04-27 DIAGNOSIS — R002 Palpitations: Secondary | ICD-10-CM | POA: Insufficient documentation

## 2012-04-27 DIAGNOSIS — I498 Other specified cardiac arrhythmias: Secondary | ICD-10-CM | POA: Insufficient documentation

## 2012-04-27 DIAGNOSIS — Z8711 Personal history of peptic ulcer disease: Secondary | ICD-10-CM | POA: Insufficient documentation

## 2012-04-27 DIAGNOSIS — I2510 Atherosclerotic heart disease of native coronary artery without angina pectoris: Secondary | ICD-10-CM | POA: Insufficient documentation

## 2012-04-27 DIAGNOSIS — R0602 Shortness of breath: Secondary | ICD-10-CM | POA: Insufficient documentation

## 2012-04-27 DIAGNOSIS — Z8701 Personal history of pneumonia (recurrent): Secondary | ICD-10-CM | POA: Insufficient documentation

## 2012-04-27 DIAGNOSIS — F17200 Nicotine dependence, unspecified, uncomplicated: Secondary | ICD-10-CM | POA: Insufficient documentation

## 2012-04-27 DIAGNOSIS — Z8679 Personal history of other diseases of the circulatory system: Secondary | ICD-10-CM | POA: Insufficient documentation

## 2012-04-27 DIAGNOSIS — I471 Supraventricular tachycardia: Secondary | ICD-10-CM

## 2012-04-27 DIAGNOSIS — Z8619 Personal history of other infectious and parasitic diseases: Secondary | ICD-10-CM | POA: Insufficient documentation

## 2012-04-27 DIAGNOSIS — Z8709 Personal history of other diseases of the respiratory system: Secondary | ICD-10-CM | POA: Insufficient documentation

## 2012-04-27 DIAGNOSIS — Z8719 Personal history of other diseases of the digestive system: Secondary | ICD-10-CM | POA: Insufficient documentation

## 2012-04-27 DIAGNOSIS — Z8639 Personal history of other endocrine, nutritional and metabolic disease: Secondary | ICD-10-CM | POA: Insufficient documentation

## 2012-04-27 DIAGNOSIS — Z79899 Other long term (current) drug therapy: Secondary | ICD-10-CM | POA: Insufficient documentation

## 2012-04-27 NOTE — ED Notes (Signed)
Pt ambulatory leaving ED with wife. Pt denies pain. Pt alert and mentating appropriately. Pt does not show signs of acute distress upon d/c. Pt given d/c teaching and has no further questions upon d/c.

## 2012-04-27 NOTE — ED Notes (Signed)
Wife's phone number [phone]

## 2012-04-27 NOTE — ED Notes (Signed)
Per EMS: pt has extensive hx of SVT; pt tried vagal maneuvers at home but was unsuccessful; pt BP on scene 102/60 alert and oriented HR 192 given 6mg  adenosine HR 100 en route; BP after 250cc 112/72. Pt denies pain. No difficulty breathing. No dizziness and lightheadedness.

## 2012-04-27 NOTE — Progress Notes (Signed)
   Received a call from the emergency room today to let me know that the patient had developed supraventricular tachycardia. He tried his usual vagal maneuvers and it persisted. He called EMS. They arrived and gave him adenosine and he converted to sinus rhythm. He will be going home from the emergency room. We will make plans for him to be seen in the office in followup by his regular cardiologist Dr. Antoine Poche.  Jerral Bonito, MD

## 2012-04-27 NOTE — ED Provider Notes (Signed)
History     CSN: [id number]  Arrival date & time 04/27/12  1701   First MD Initiated Contact with Patient 04/27/12 1706      Chief Complaint  Patient presents with  . Tachycardia    resovled SVT     Patient is a 72 y.o. male presenting with palpitations. The history is provided by the patient and the spouse.  Palpitations  This is a recurrent problem. Episode onset: just prior to arrival. The problem occurs constantly. The problem has been resolved. Associated with: nothing. Associated symptoms include shortness of breath. Pertinent negatives include no chest pain, no chest pressure, no syncope, no abdominal pain and no vomiting. Treatments tried: adenosine. The treatment provided significant relief.   Pt reports he was at grocery store when he noted his heart rate was elevated He reports long h/o SVT and usually will respond to vagal maneuvers This did not improve his symptoms He called EMS who gave adenosine which stopped the rhythm He is now improved He reports he is at his baseline Denies cp at this time No syncope No vomiting/diarrhea He is requesting d/c home He has refused ablation in the past He has been taking his medications Past Medical History  Diagnosis Date  . Anxiety   . CAD (coronary artery disease)     last catheterization 2004, medical management.  Marland Kitchen GERD (gastroesophageal reflux disease)   . PUD (peptic ulcer disease)     History of  . AAA (abdominal aortic aneurysm)     dx 2007   . Hyperglycemia   . Hyperlipidemia   . SVT (supraventricular tachycardia)   . Tobacco abuse   . Enlargement of lymph nodes   . Granulomatous lung disease   . Hiatal hernia   . Diverticulosis of colon (without mention of hemorrhage)   . Unspecified hemorrhoids without mention of complication   . Personal history of colonic polyps 01/18/2003 & 01/14/1999    hyperplastic polyps (2004) & Tubular adenoma(2000)  . Gastroparesis   . Anal fissure   . Cardiac arrhythmia   .  History of pneumonia     Past Surgical History  Procedure Date  . Appendectomy   . Tonsillectomy   . Abdominal surgery     Vagotomy  and pyloroplasty due to bleeding ulcer, 1999    Family History  Problem Relation Age of Onset  . Diabetes Mother   . Prostate cancer Father   . Colon polyps Mother     History  Substance Use Topics  . Smoking status: Current Every Day Smoker -- 1.0 packs/day for 50 years    Types: Cigarettes  . Smokeless tobacco: Never Used  . Alcohol Use: No      Review of Systems  Respiratory: Positive for shortness of breath.   Cardiovascular: Positive for palpitations. Negative for chest pain and syncope.  Gastrointestinal: Negative for vomiting and abdominal pain.  All other systems reviewed and are negative.    Allergies  Codeine; Ivp dye; Metronidazole; and Percocet  Home Medications   Current Outpatient Rx  Name  Route  Sig  Dispense  Refill  . ACETAMINOPHEN 500 MG PO TABS   Oral   Take 1,000 mg by mouth every 6 (six) hours as needed. For pain         . CYANOCOBALAMIN 1000 MCG/ML IJ SOLN      Inject once, every 4 weeks.         Marland Kitchen FOLIC ACID 1 MG PO TABS   Oral  Take 1 tablet (1 mg total) by mouth daily.   30 tablet   12   . VERAPAMIL HCL ER 180 MG PO CP24   Oral   Take 1 capsule (180 mg total) by mouth daily.   30 capsule   6   . NITROGLYCERIN 0.4 MG SL SUBL   Sublingual   Place 0.4 mg under the tongue every 5 (five) minutes as needed.           BP 95/73  Pulse 101  Temp 97.9 F (36.6 C) (Oral)  Resp 16  SpO2 97%  Physical Exam CONSTITUTIONAL: Well developed/well nourished HEAD AND FACE: Normocephalic/atraumatic EYES: EOMI/PERRL ENMT: Mucous membranes moist NECK: supple no meningeal signs SPINE:entire spine nontender CV: S1/S2 noted, no murmurs/rubs/gallops noted LUNGS: Lungs are clear to auscultation bilaterally, no apparent distress ABDOMEN: soft, nontender, no rebound or guarding GU:no cva  tenderness NEURO: Pt is awake/alert, moves all extremitiesx4 EXTREMITIES: pulses normal, full ROM SKIN: warm, color normal PSYCH: no abnormalities of mood noted  ED Course  Procedures (  5:41 PM prehospital EKG reviewed and shows SVT, and repeat rhythm strip shows sinus rhythm rate controlled  Pt now back to baseline I spoke to cardiology (dr Myrtis Ser) who will arrange followup.  Pt does not want further workup as he report this is similar to prior episodes Stable for d/c  MDM  Nursing notes including past medical history and social history reviewed and considered in documentation        Date: 04/27/2012 1714  Rate: 72  Rhythm: normal sinus rhythm  QRS Axis: right  Intervals: normal  ST/T Wave abnormalities: nonspecific ST changes  Conduction Disutrbances:none  Narrative Interpretation:   Old EKG Reviewed: unchanged from 04/10/11    Joya Gaskins, MD 04/27/12 [phone]

## 2012-05-10 ENCOUNTER — Encounter: Payer: Medicare PPO | Admitting: Physician Assistant

## 2012-05-19 ENCOUNTER — Telehealth: Payer: Self-pay | Admitting: Gastroenterology

## 2012-05-19 NOTE — Telephone Encounter (Signed)
Last OV 07/11/11 for alternating constipation and diarrhea. Hx of constipation, reflux, colonic polyps, FUO, abnormal weight loss, peptic ulcer, inactive TB, pyloroplasty, . Last ECL 4 26/13 with Colon revealing severe diverticulitis and EGD with benign mucosa in smallbowel and benign gastric polyps; esophageal bx at [facility] with hyperplastic polyp. Pt reports pain below his waist from his navel down in the entire area for 2 weeks. He went to his Urologist and was found to have an infected prostate and he is on Cipro 250mg  bid x 1 month. He denies blood in his stool and diarrhea. He uses stool softeners and is not constipated. He has chronic lo grade temp, chills and night sweats. Do we need to see pt or can we give him something for pain?  Thanks.

## 2012-05-19 NOTE — Telephone Encounter (Signed)
Per Dr Jarold Motto, scheduled pt to see Willette Cluster, NP on 05/21/12.

## 2012-05-19 NOTE — Telephone Encounter (Signed)
See PA ?

## 2012-05-20 ENCOUNTER — Encounter: Payer: Medicare PPO | Admitting: Physician Assistant

## 2012-05-20 ENCOUNTER — Encounter: Payer: Self-pay | Admitting: *Deleted

## 2012-05-21 ENCOUNTER — Ambulatory Visit: Payer: Medicare PPO | Admitting: Nurse Practitioner

## 2012-06-30 ENCOUNTER — Telehealth: Payer: Self-pay | Admitting: Internal Medicine

## 2012-06-30 ENCOUNTER — Ambulatory Visit (INDEPENDENT_AMBULATORY_CARE_PROVIDER_SITE_OTHER): Payer: Medicare PPO | Admitting: Internal Medicine

## 2012-06-30 VITALS — BP 108/72 | HR 97 | Temp 98.0°F | Wt 157.0 lb

## 2012-06-30 DIAGNOSIS — R251 Tremor, unspecified: Secondary | ICD-10-CM | POA: Insufficient documentation

## 2012-06-30 DIAGNOSIS — R6883 Chills (without fever): Secondary | ICD-10-CM

## 2012-06-30 DIAGNOSIS — R634 Abnormal weight loss: Secondary | ICD-10-CM

## 2012-06-30 DIAGNOSIS — E538 Deficiency of other specified B group vitamins: Secondary | ICD-10-CM

## 2012-06-30 DIAGNOSIS — F3289 Other specified depressive episodes: Secondary | ICD-10-CM

## 2012-06-30 DIAGNOSIS — R259 Unspecified abnormal involuntary movements: Secondary | ICD-10-CM

## 2012-06-30 DIAGNOSIS — F329 Major depressive disorder, single episode, unspecified: Secondary | ICD-10-CM

## 2012-06-30 LAB — HEMOGLOBIN A1C: Hgb A1c MFr Bld: 5.8 % (ref 4.6–6.5)

## 2012-06-30 MED ORDER — BUPROPION HCL 100 MG PO TABS: 100.0000 mg | ORAL_TABLET | Freq: Two times a day (BID) | ORAL | Status: DC

## 2012-06-30 MED ORDER — ESCITALOPRAM OXALATE 10 MG PO TABS: 10.0000 mg | ORAL_TABLET | Freq: Every day | ORAL | Status: DC

## 2012-06-30 NOTE — Patient Instructions (Addendum)
Please get your x-ray at the other [place]  office located at: [address] [facility], across from [facility].  Please go to the basement, this is a walk-in facility, they are open from 8:30 to 5:30 PM. Phone number [phone]. --- Please come back in 6 weeks

## 2012-06-30 NOTE — Assessment & Plan Note (Addendum)
On going chills and documented weight loss ~ 14 lb in 3 months. He does have some depression and poor appetite. Unclear if he is depressed because he feels poorly or the other way around. He also has some evidence of parkinsonism which could contribute to the weight loss. Labs reviewed, multiple normal TSHs, no anemia. "mass" in the chest felt to be the xyphoid  process. Plan: Will ask ID to reassess Labs, XRs: Hemoglobin A1c, recheck testosterone which was in the low side, chest x-ray. Weight loss could be from Parkinson's syndrome, will refer to neurology. Reassess in 3 months Treat depression

## 2012-06-30 NOTE — Assessment & Plan Note (Addendum)
See comments under "chills". We performed PHQ 9, he scores 16 which is moderate to severe depression. Symptoms could be because he feels poorly or the other way around. No suicidal Plan: Start Lexapro, reassess in 6 weeks. Addendum pt reports [facility] thus will try wellbutrin 100 mg 1 qd x 2 weeks, then 1 po bid

## 2012-06-30 NOTE — Telephone Encounter (Signed)
Pt has an appt today with Dr. Drue Novel @ 130p.

## 2012-06-30 NOTE — Progress Notes (Signed)
  Subjective:    Patient ID: Dennis Combs, male    DOB: 1940/09/26, 72 y.o.   MRN: [id number]  HPI Acute visit Chief complaint today is a knot @ the  anterior chest. On further conversation, he reports weight loss, 14 pounds in the last 3 months, weight loss is not on purpose. He is still has nocturnal chills, that has not changed in the last few months. Also reports hand tremors, having difficult time writing, doing his shirts or even eating. Denies any head or jaw tremor.   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 Cath 2004 for CP:   "The patient has total occlusion of the dominant circumflex which is stable compared with August 2001.  There is no change in his coronary anatomy to explain the change in his symptoms.  Will add nitrates for control of his angina" Peri-rectal abscess 2013   Family History: Prostate Ca - DAD colon ca--no DM - M MI-- no  Social History: Married, 4 children, 7 G children tobacco--  > 1PPD  ETOH-- quit 06-2011  still very active    Review of Systems When asked, admits to depression "because I don't feel well",  "the days I feel better I'm not depressed" No fever, no nausea, vomiting, diarrhea or blood in the stools. No cough Reports he feels quite fatigued, good compliance with B12 shots.    Objective:   Physical Exam  General -- alert, no apparent distress .   Neck --no thyromegaly ,  no LADs, no supraclavicular mass Lymphatic system-- no axillary or inguinal LADs Lungs -- normal respiratory effort, no intercostal retractions, no accessory muscle use, and normal breath sounds.   Heart--  normal rate, regular rhythm, no murmur, and no gallop.   Abdomen--soft, non-tender, no distention, no masses, no HSM, no guarding, and no rigidity.  Prominent xiphoid process Extremities-- no pretibial edema bilaterally Skin-- scaly rash of the forehead and around nose and mouth Neurologic-- alert & oriented X3 ; definitely has an action tremor. Psych-- Cognition and judgment appear intact. Alert and cooperative with normal attention span and concentration. + Depressed appearing.    Assessment & Plan:

## 2012-06-30 NOTE — Assessment & Plan Note (Addendum)
Today, the patient reports action tremor to the point that he is unable to do or undue his own shirt, has a hard time writing. He also has weight loss and face eczema.  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  Will refer to neurology, Parkinson syndrome?

## 2012-06-30 NOTE — Assessment & Plan Note (Signed)
Good compliance w/ shots so far

## 2012-06-30 NOTE — Telephone Encounter (Signed)
Patient Information:  Caller Name: Audrey  Phone: [phone]  Patient: Dennis Combs, Dennis Combs  Gender: Male  DOB: 1941-03-05  Age: 72 Years  PCP: Willow Ora  Office Follow Up:  Does the office need to follow up with this patient?: No  Instructions For The Office: N/A  RN Note:  Advised to see MD today or tomorrow due to nursing judgement regarding size of growth and wt loss. Also reported slow healing toe after surgery for ingrown toenail.  Symptoms  Reason For Call & Symptoms: Enalarging goose egg-sized "knot" in mid chest for 2 months.  Painful to touch the lump, no redensss.  History of hiatal hernia. Reports unintentional (21 lb) weight loss over last 6 months due to anorexia.  Reviewed Health History In EMR: Yes  Reviewed Medications In EMR: Yes  Reviewed Allergies In EMR: Yes  Reviewed Surgeries / Procedures: Yes  Date of Onset of Symptoms: 05/02/2012  Guideline(s) Used:  Skin Lesion - Moles or Growths  Disposition Per Guideline:   See Within 3 Days in Office  Reason For Disposition Reached:   Patient wants to be seen  Advice Given:  Call Back If:  Fever or pain occurs  You become worse.  RN Overrode Recommendation:  Make Appointment  Advised to see MD within 24 hours per nursing judgement.  Appointment Scheduled:  06/30/2012 13:30:00 Appointment Scheduled Provider:  Willow Ora

## 2012-07-01 ENCOUNTER — Encounter: Payer: Self-pay | Admitting: Internal Medicine

## 2012-07-01 ENCOUNTER — Ambulatory Visit (INDEPENDENT_AMBULATORY_CARE_PROVIDER_SITE_OTHER)
Admission: RE | Admit: 2012-07-01 | Discharge: 2012-07-01 | Disposition: A | Discharge status: Home / Self Care | Payer: Medicare PPO | Source: Ambulatory Visit | Attending: Internal Medicine | Admitting: Internal Medicine

## 2012-07-01 DIAGNOSIS — R634 Abnormal weight loss: Secondary | ICD-10-CM

## 2012-07-05 ENCOUNTER — Encounter: Payer: Self-pay | Admitting: *Deleted

## 2012-07-19 ENCOUNTER — Ambulatory Visit: Payer: Medicare PPO | Admitting: Neurology

## 2012-07-20 ENCOUNTER — Ambulatory Visit: Payer: Medicare PPO | Admitting: Neurology

## 2012-07-21 ENCOUNTER — Encounter: Payer: Self-pay | Admitting: Internal Medicine

## 2012-07-21 ENCOUNTER — Ambulatory Visit (INDEPENDENT_AMBULATORY_CARE_PROVIDER_SITE_OTHER): Payer: Medicare PPO | Admitting: Internal Medicine

## 2012-07-21 VITALS — BP 124/82 | HR 102 | Temp 97.9°F | Wt 155.0 lb

## 2012-07-21 DIAGNOSIS — R634 Abnormal weight loss: Secondary | ICD-10-CM

## 2012-07-21 DIAGNOSIS — R6883 Chills (without fever): Secondary | ICD-10-CM

## 2012-07-21 NOTE — Progress Notes (Signed)
  Subjective:    Patient ID: Dennis Combs, male    DOB: 05-Mar-1941, 72 y.o.   MRN: [id number]  HPI He comes in for reevaluation. He has been seen here by Dr. Ninetta Lights 2 times previously for weight loss, chills and temperature up to 99. He comes back now after having a 30 pound weight loss. This has been mainly do to poor appetite. In the past, he has been seen by multiple physicians including an infectious disease physician at the [place]. He has had workup including labs, radiography, colonoscopy, EGD. This has been over the last 3 years. His new symptom besides the weight loss is a resting tremor. He has also had difficulty walking and feels imbalance. He has had some chest pain that is palpable and associated with movement and with a "knot "in his chest.  Has neurology appt next week.  Old Tb,  Ruled out at HD last year.  No appetite is cause of weight loss.  Work up by GI unrevealing.  No further diarreha, constipation.    Review of Systems  Constitutional: Positive for chills, activity change, fatigue and unexpected weight change.       Temp to 99  Respiratory: Positive for shortness of breath. Negative for cough, choking and chest tightness.        Some chronic sob associated with smoking  Cardiovascular: Negative for palpitations and leg swelling.  Gastrointestinal: Negative for nausea, vomiting, abdominal pain, diarrhea, constipation and blood in stool.  Genitourinary: Negative for hematuria and discharge.  Musculoskeletal: Positive for gait problem. Negative for myalgias, joint swelling and arthralgias.  Skin: Negative for rash.  Neurological: Positive for tremors. Negative for dizziness, seizures, syncope, weakness, light-headedness and headaches.  Hematological: Negative for adenopathy.  Psychiatric/Behavioral: Negative for dysphoric mood. The patient is not nervous/anxious.        Objective:   Physical Exam  Constitutional: He is oriented to person, place, and time. He appears  well-developed and well-nourished. No distress.  HENT:  Mouth/Throat: Oropharynx is clear and moist. No oropharyngeal exudate.  Eyes: No scleral icterus.  Cardiovascular: Normal rate, regular rhythm and normal heart sounds.  Exam reveals no gallop and no friction rub.   No murmur heard. Pulmonary/Chest: Effort normal and breath sounds normal. No respiratory distress. He has no wheezes. He has no rales.  Abdominal: Soft. Bowel sounds are normal. He exhibits no distension. There is no tenderness. There is no rebound.  Musculoskeletal: Normal range of motion.  Lymphadenopathy:    He has no cervical adenopathy.       Right cervical: No superficial cervical, no deep cervical and no posterior cervical adenopathy present.      Left cervical: No superficial cervical, no deep cervical and no posterior cervical adenopathy present.       Right axillary: No pectoral and no lateral adenopathy present.       Left axillary: No pectoral and no lateral adenopathy present.      Right: No supraclavicular adenopathy present.       Left: No supraclavicular adenopathy present.  Neurological: He is alert and oriented to person, place, and time.  Skin: Skin is warm and dry. No rash noted. No erythema.  Psychiatric: He has a normal mood and affect.          Assessment & Plan:

## 2012-07-21 NOTE — Assessment & Plan Note (Signed)
No new concerning signs in regards to infection.  I offered to recheck labs and CT chest.  He will defer CT for now.  Check ESR, CRP and blood cultures.   Return PRN or if an abnormals

## 2012-07-22 ENCOUNTER — Other Ambulatory Visit: Payer: Self-pay | Admitting: Internal Medicine

## 2012-07-22 LAB — CBC WITH DIFFERENTIAL/PLATELET
Basophils Relative: 1 % (ref 0–1)
Eosinophils Absolute: 0.1 10*3/uL (ref 0.0–0.7)
Eosinophils Relative: 1 % (ref 0–5)
Hemoglobin: 14.3 g/dL (ref 13.0–17.0)
Lymphs Abs: 1.3 10*3/uL (ref 0.7–4.0)
MCH: 27.7 pg (ref 26.0–34.0)
MCHC: 32.8 g/dL (ref 30.0–36.0)
MCV: 84.5 fL (ref 78.0–100.0)
Monocytes Absolute: 0.5 10*3/uL (ref 0.1–1.0)
Monocytes Relative: 6 % (ref 3–12)
RBC: 5.16 MIL/uL (ref 4.22–5.81)

## 2012-07-22 LAB — C-REACTIVE PROTEIN: CRP: <0.5 mg/dL (ref <0.60)

## 2012-07-23 ENCOUNTER — Encounter: Payer: Medicare PPO | Admitting: Internal Medicine

## 2012-07-26 ENCOUNTER — Ambulatory Visit (INDEPENDENT_AMBULATORY_CARE_PROVIDER_SITE_OTHER): Payer: Medicare PPO | Admitting: Neurology

## 2012-07-26 ENCOUNTER — Encounter: Payer: Self-pay | Admitting: Neurology

## 2012-07-26 VITALS — BP 108/68 | HR 92 | Temp 97.6°F | Resp 20 | Wt 153.0 lb

## 2012-07-26 DIAGNOSIS — R279 Unspecified lack of coordination: Secondary | ICD-10-CM

## 2012-07-26 DIAGNOSIS — R251 Tremor, unspecified: Secondary | ICD-10-CM

## 2012-07-26 DIAGNOSIS — G609 Hereditary and idiopathic neuropathy, unspecified: Secondary | ICD-10-CM

## 2012-07-26 DIAGNOSIS — R270 Ataxia, unspecified: Secondary | ICD-10-CM

## 2012-07-26 DIAGNOSIS — R259 Unspecified abnormal involuntary movements: Secondary | ICD-10-CM

## 2012-07-26 DIAGNOSIS — R269 Unspecified abnormalities of gait and mobility: Secondary | ICD-10-CM

## 2012-07-26 DIAGNOSIS — E538 Deficiency of other specified B group vitamins: Secondary | ICD-10-CM

## 2012-07-26 NOTE — Patient Instructions (Addendum)
Your MRI is scheduled on Wednesday, May 7th at 12:30 pm. Please arrive at 12:00 noon. [facility] is located at [address].   [phone]  Follow up in one month with Dr. Arbutus Leas.

## 2012-07-26 NOTE — Progress Notes (Signed)
Dennis Combs was seen today in the movement disorders clinic for neurologic consultation at the request of Willow Ora, MD.  The consultation is for the evaluation of tremor and possible PD.  The pt is R hand dominant but reports tremor in hands b/l and legs.  It started over a year ago, but it has gotten worse.   It is worse in the AM.  He has trouble eating in the AM and tying shoes with it.  It is intermittent and waxes/wanes.  Specific Symptoms:  Tremor: yes Voice: weakened over 7-8 months Sleep: naps multiple times during day and trouble at night.  Falls asleep at night quickly and wakes up 2 hours later to use the bathroom and trouble getting to sleep  Vivid Dreams:  yes  Acting out dreams:  no Wet Pillows: no Postural symptoms:  yes  Falls?  yes (twice last week but none 3 months before that.  With one got up too fast and fell.  Other trying to change license plate off car and fell) Bradykinesia symptoms: difficulty getting out of a chair Loss of smell:  yes Loss of taste:  yes Urinary Incontinence:  no Difficulty Swallowing:  yes (somewhat sore, but rare choking/coughing) Handwriting, micrographia: yes Trouble with ADL's:  yes (puts pants on from seated position, trouble tying shoes but that is better)  Trouble buttoning clothing: yes Depression:  yes and states been "hateful" Memory changes:  yes, short term  Hallucinations:  no  visual distortions: yes N/V:  no Lightheaded:  no  Syncope: no Diplopia:  no Dyskinesia:  no  Neuroimaging has not previously been performed. He may have had some type of imaging in past (?CT vs MRI) and had reaction to the contrast.  Chart notes indicate it was an iodinated dye so perhaps it was CT.  Nonetheless pt reports he is unsure.    PREVIOUS MEDICATIONS: none to date  Tried a medication for tremor long time ago and caused diarrhea.  Not sure name of medication and was given by [place].  ALLERGIES:   Allergies  Allergen Reactions  .  Codeine     REACTION: whelps,rash,itching  . Ivp Dye (Iodinated Diagnostic Agents) Hives, Itching and Other (See Comments)    Mouth itching  . Metronidazole     REACTION: n,v,d  . Percocet (Oxycodone-Acetaminophen) Itching    CURRENT MEDICATIONS:  Current Outpatient Prescriptions on File Prior to Visit  Medication Sig Dispense Refill  . acetaminophen (TYLENOL) 500 MG tablet Take 1,000 mg by mouth every 6 (six) hours as needed. For pain      . buPROPion (WELLBUTRIN) 100 MG tablet Take 1 tablet (100 mg total) by mouth 2 (two) times daily.  60 tablet  3  . cyanocobalamin (,VITAMIN B-12,) 1000 MCG/ML injection Inject once, every 4 weeks.      . folic acid (FOLVITE) 1 MG tablet Take 1 tablet (1 mg total) by mouth daily.  30 tablet  12  . nitroGLYCERIN (NITROSTAT) 0.4 MG SL tablet Place 0.4 mg under the tongue every 5 (five) minutes as needed.      . verapamil (VERELAN PM) 180 MG 24 hr capsule Take 1 capsule (180 mg total) by mouth daily.  30 capsule  6   No current facility-administered medications on file prior to visit.    PAST MEDICAL HISTORY:   Past Medical History  Diagnosis Date  . Anxiety   . CAD (coronary artery disease)     last catheterization 2004, medical management.  Marland Kitchen  GERD (gastroesophageal reflux disease)   . PUD (peptic ulcer disease)   . AAA (abdominal aortic aneurysm) 2007  . Hyperglycemia   . Hyperlipidemia   . SVT (supraventricular tachycardia)   . Tobacco abuse   . Enlargement of lymph nodes   . Granulomatous lung disease   . Hiatal hernia   . Diverticulosis of colon (without mention of hemorrhage)   . Hemorrhoids   . Personal history of colonic polyps     hyperplastic polyps (2004) & Tubular adenoma(2000)  . Gastroparesis   . Anal fissure   . Cardiac arrhythmia   . History of pneumonia   . Perirectal abscess 02/06/12  . COPD (chronic obstructive pulmonary disease)     PAST SURGICAL HISTORY:   Past Surgical History  Procedure Laterality Date  .  Appendectomy    . Tonsillectomy    . Abdominal surgery  1999    Vagotomy and pyloroplasty due to bleeding ulcer    SOCIAL HISTORY:   History   Social History  . Marital Status: Married    Spouse Name: N/A    Number of Children: 4  . Years of Education: N/A   Occupational History  . retired     Airline pilot   Social History Main Topics  . Smoking status: Current Every Day Smoker -- 0.50 packs/day for 50 years    Types: Cigarettes  . Smokeless tobacco: Never Used     Comment: down from several ppd  . Alcohol Use: No     Comment: previous alcohol abuse, quit 2 years ago  . Drug Use: No  . Sexually Active: Not on file   Other Topics Concern  . Not on file   Social History Narrative  . No narrative on file    FAMILY HISTORY:   Family Status  Relation Status Death Age  . Mother Deceased     pneumonia (hosp acquired)  . Father Deceased     coal miners lung  . Brother Deceased     accident  . Sister Alive     DVT's  . Child Alive     4, alive and well    ROS:  Night sweats, 30# weight loss over last 6 months but stabilized per pt now.  Extensive w/u negative per pt/chart. Poor appetite per pt and wife.   A complete 10 system review of systems was obtained and was unremarkable apart from what is mentioned above.  PHYSICAL EXAMINATION:    VITALS:   Filed Vitals:   07/26/12 1248  BP: 108/68  Pulse: 92  Temp: 97.6 F (36.4 C)  Resp: 20  Weight: 153 lb (69.4 kg)   Wt Readings from Last 3 Encounters:  07/26/12 153 lb (69.4 kg)  07/21/12 155 lb (70.308 kg)  06/30/12 157 lb (71.215 kg)     GEN:  The patient appears stated age and is in NAD. HEENT:  Normocephalic, atraumatic.  The mucous membranes are moist. The superficial temporal arteries are without ropiness or tenderness. CV:  RRR Lungs:  CTAB Neck/HEME:  There are no carotid bruits bilaterally.  Neurological examination:  Orientation: The patient is alert and oriented x3. Fund of knowledge is appropriate.   Recent and remote memory are intact.  Attention and concentration are normal.    Able to name objects and repeat phrases. Cranial nerves: There is good facial symmetry. Pupils are equal round and reactive to light bilaterally. Fundoscopic exam reveals clear margins bilaterally. Extraocular muscles are intact. The visual fields are full to  confrontational testing. The speech is fluent and clear. Soft palate rises symmetrically and there is no tongue deviation. Hearing is intact to conversational tone. Sensation: Sensation is intact to light and pinprick throughout (facial, trunk, extremities). Vibration is decreased at the bilateral big toe and ankle. There is no extinction with double simultaneous stimulation. There is no sensory dermatomal level identified. Motor: Strength is 5/5 in the bilateral upper and lower extremities.   Shoulder shrug is equal and symmetric.  There is no pronator drift. Deep tendon reflexes: Deep tendon reflexes are 1/4 at the bilateral biceps, triceps, brachioradialis, patella and absent at the bilateral achilles. Plantar responses are downgoing bilaterally.  Movement examination: Tone: There is normal tone in the bilateral upper extremities.  The tone in the lower extremities is normal.  Abnormal movements: no rest tremor.  Minimal nonrhythmic tremor of the outstretched hands. Coordination:  There is no decremation with RAM's, Including alternating supination and pronation of the forearm, hand opening and closing, finger taps, heel taps and toe taps. Gait and Station: The patient has minimal difficulty arising out of a deep-seated chair without the use of the hands but is able to do this on the first attempt. The patient's stride length is normal but he is wide based and ataxic.     LABS:  Lab Results  Component Value Date   VITAMINB12 333 03/25/2012   Lab Results  Component Value Date   WBC 8.3 07/21/2012   HGB 14.3 07/21/2012   HCT 43.6 07/21/2012   MCV 84.5 07/21/2012    PLT 207 07/21/2012     Chemistry      Component Value Date/Time   NA 136 03/25/2012 0843   K 3.8 03/25/2012 0843   CL 101 03/25/2012 0843   CO2 25 03/25/2012 0843   BUN 9 03/25/2012 0843   CREATININE 0.7 03/25/2012 0843      Component Value Date/Time   CALCIUM 8.9 03/25/2012 0843   ALKPHOS 52 03/25/2012 0843   AST 14 03/25/2012 0843   ALT 12 03/25/2012 0843   BILITOT 0.9 03/25/2012 0843       Last hgBA1C was 5.8.  ASSESSMENT/PLAN:   1.  Tremor.    -I see no evidence of a neurodegenerative condition such as Parkinsons disease.  The patient is not rigid and has no bradykinesia.  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  He wants no treatment but if he changes his mind in the future, primidone may be an option. 2.  Gait instability.  -I suspect that this is due to peripheral neuropathy which may be due to past history of alcohol abuse.  -I am going to check PN labs to make sure that nothing else is going on.  He does have hx of B12 deficiency and is getting injections.  Despite that, his last B12 was 333 and I would suspect that to be higher with injections, so will check homocysteine and MMA as well. 3.  I will follow up with him in a month.  If negative, he does not need to follow up regularly in the movement clinic.

## 2012-07-27 ENCOUNTER — Other Ambulatory Visit: Payer: Self-pay

## 2012-07-27 ENCOUNTER — Other Ambulatory Visit: Payer: Self-pay | Admitting: *Deleted

## 2012-07-27 DIAGNOSIS — G609 Hereditary and idiopathic neuropathy, unspecified: Secondary | ICD-10-CM

## 2012-07-27 LAB — HOMOCYSTEINE: Homocysteine: 10.7 umol/L (ref 4.0–15.4)

## 2012-07-27 LAB — VITAMIN B12: Vitamin B-12: 308 pg/mL (ref 211–911)

## 2012-07-27 LAB — CULTURE, BLOOD (SINGLE): Organism ID, Bacteria: NO GROWTH

## 2012-07-28 ENCOUNTER — Telehealth: Payer: Self-pay

## 2012-07-28 LAB — PROTEIN ELECTROPHORESIS, SERUM
Albumin ELP: 58.7 % (ref 55.8–66.1)
Alpha-1-Globulin: 4.7 % (ref 2.9–4.9)
Alpha-2-Globulin: 10 % (ref 7.1–11.8)
Beta 2: 4.2 % (ref 3.2–6.5)

## 2012-07-28 LAB — IMMUNOFIXATION ELECTROPHORESIS
IgA: 205 mg/dL (ref 68–379)
IgM, Serum: 112 mg/dL (ref 41–251)
IgM, Serum: 112 mg/dL (ref 41–251)
Total Protein, Serum Electrophoresis: 7.5 g/dL (ref 6.0–8.3)

## 2012-07-28 NOTE — Telephone Encounter (Signed)
Pt requesting med for anxiety prior to having his MRI done at 1:00 today.

## 2012-07-28 NOTE — Telephone Encounter (Signed)
I would prefer he try without which is why we set him up with [facility] as their MRI is very open

## 2012-07-28 NOTE — Telephone Encounter (Signed)
Spoke with pt and gave him Dr. Don Perking advice, but he refused to even consider it.  He said he had already canceled the appointment and then hung up on me.  Dr. Arbutus Leas is aware.

## 2012-07-29 ENCOUNTER — Other Ambulatory Visit: Payer: Self-pay | Admitting: Internal Medicine

## 2012-08-04 LAB — METHYLMALONIC ACID, SERUM: Methylmalonic Acid, Quant: 0.21 umol/L (ref <0.40)

## 2012-08-06 ENCOUNTER — Telehealth: Payer: Self-pay | Admitting: *Deleted

## 2012-08-06 ENCOUNTER — Encounter: Payer: Self-pay | Admitting: *Deleted

## 2012-08-06 NOTE — Telephone Encounter (Signed)
Patient called and ask that the results of his recent lab work be sent to him. He left a message on the voicemail and I was unable to reach him when I returned the call so I left a message and will mail them to him at his home address.

## 2012-08-13 ENCOUNTER — Ambulatory Visit: Payer: Medicare PPO | Admitting: Internal Medicine

## 2012-08-13 ENCOUNTER — Telehealth: Payer: Self-pay | Admitting: *Deleted

## 2012-08-13 NOTE — Telephone Encounter (Signed)
[employer] will not approve patients CT scan on the information that was submitted, which was all office visits, labs, and xray results. They are requesting a peer to peer with Dr. Luciana Axe. A physician will call and if he is not here a number will be provided to call back. Dennis Combs

## 2012-08-24 ENCOUNTER — Telehealth: Payer: Self-pay | Admitting: Neurology

## 2012-08-24 NOTE — Telephone Encounter (Signed)
Pt cancelled one month follow up appt. He does not wish to r/s at this time. He says he does not need to be seen / Sherri S.

## 2012-08-26 ENCOUNTER — Ambulatory Visit: Payer: Medicare PPO | Admitting: Neurology

## 2012-08-26 ENCOUNTER — Ambulatory Visit: Payer: Medicare PPO | Admitting: Internal Medicine

## 2012-08-27 ENCOUNTER — Ambulatory Visit: Payer: Medicare PPO | Admitting: Neurology

## 2012-08-27 ENCOUNTER — Encounter: Payer: Self-pay | Admitting: Neurology

## 2012-09-02 ENCOUNTER — Ambulatory Visit (INDEPENDENT_AMBULATORY_CARE_PROVIDER_SITE_OTHER): Payer: Medicare PPO | Admitting: Internal Medicine

## 2012-09-02 VITALS — BP 108/70 | HR 100 | Temp 98.4°F | Wt 146.0 lb

## 2012-09-02 DIAGNOSIS — Z125 Encounter for screening for malignant neoplasm of prostate: Secondary | ICD-10-CM

## 2012-09-02 DIAGNOSIS — R6883 Chills (without fever): Secondary | ICD-10-CM

## 2012-09-02 DIAGNOSIS — R269 Unspecified abnormalities of gait and mobility: Secondary | ICD-10-CM

## 2012-09-02 DIAGNOSIS — F329 Major depressive disorder, single episode, unspecified: Secondary | ICD-10-CM

## 2012-09-02 DIAGNOSIS — R259 Unspecified abnormal involuntary movements: Secondary | ICD-10-CM

## 2012-09-02 DIAGNOSIS — E538 Deficiency of other specified B group vitamins: Secondary | ICD-10-CM

## 2012-09-02 DIAGNOSIS — R251 Tremor, unspecified: Secondary | ICD-10-CM

## 2012-09-02 LAB — BASIC METABOLIC PANEL
CO2: 24 mEq/L (ref 19–32)
GFR: 112.09 mL/min (ref >60.00)
Glucose, Bld: 98 mg/dL (ref 70–99)
Potassium: 4.6 mEq/L (ref 3.5–5.1)
Sodium: 139 mEq/L (ref 135–145)

## 2012-09-02 MED ORDER — CYANOCOBALAMIN 1000 MCG/ML IJ SOLN: 1000.0000 ug | INTRAMUSCULAR | Status: DC

## 2012-09-02 MED ORDER — DIAZEPAM 2 MG PO TABS: 2.0000 mg | ORAL_TABLET | Freq: Four times a day (QID) | ORAL | Status: DC | PRN

## 2012-09-02 MED ORDER — CYANOCOBALAMIN 1000 MCG/ML IJ SOLN
1000.0000 ug | Freq: Once | INTRAMUSCULAR | Status: AC
  Administered 2012-09-02: 1000 ug via INTRAMUSCULAR

## 2012-09-02 NOTE — Patient Instructions (Addendum)
Will schedule your CT chest and abdomen. Come back once a week for B12 shot 3 more times and then once a month. Make the appointments. Take Valium only as needed Next visit with me in 3 months.

## 2012-09-02 NOTE — Assessment & Plan Note (Signed)
Self discontinue Lexapro and Wellbutrin, they didn't do anything for him he actually feels worse when he takes medication

## 2012-09-02 NOTE — Progress Notes (Signed)
  Subjective:    Patient ID: Dennis Combs, male    DOB: [birth year], 72 y.o.   MRN: [id number]  HPI Since the last time he was here, he saw neurology and ID, no explanation for weight loss was found, labs reviewed. he continued feeling poorly, Has lost additional weight. Strength  continue to be decrease, wife is helping with most ADL such as open a soda.   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 Cath 2004 for CP:   "The patient has total occlusion of the dominant circumflex which is stable compared with August 2001.  There is no change in his coronary anatomy to explain the change in his symptoms.  Will add nitrates for control of his angina" Peri-rectal abscess 2013   Family History: Prostate Ca - DAD colon ca--no DM - M MI-- no  Social History: Married, 4 children, 7 G children tobacco--  > 1PPD   ETOH-- quit 06-2011   still very active     Review of Systems No cough. Still having chills and feeling cold. Note nausea, vomiting, diarrhea. Appetite is decreased. Very rarely has postprandial abdominal pain. No headaches. He self discontinue Wellbutrin, took Lexapro for one month and also stop it "I get more depressed w/ medications "     Objective:   Physical Exam   General -- alert, no apparent distress .  Lungs -- normal respiratory effort, no intercostal retractions, no accessory muscle use, and normal breath sounds.  Heart-- normal rate, regular rhythm, no murmur, and no gallop.  Abdomen--soft, non-tender, no distention, no masses, no HSM  Extremities-- no pretibial edema bilaterally   Neurologic-- alert &  oriented X3   Psych-- Cognition and judgment appear intact. Alert and cooperative with normal attention span and concentration. No depressed appearing today.      Assessment & Plan:

## 2012-09-02 NOTE — Assessment & Plan Note (Signed)
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 

## 2012-09-02 NOTE — Assessment & Plan Note (Addendum)
Ongoing problem, lost 7 pounds in 6 weeks. Since the last time he was here, saw ID, no infectious process found. Sedimentation rate was normal. Plan: Must rule out occult malignancy. Will get a BMP, then CT chest and abdomen. Also get a peripheral blood smear, PSA. If results negative, referred to [facility].

## 2012-09-02 NOTE — Assessment & Plan Note (Signed)
Not taking B12 shot consistently. Plan: B12 shot weekly, then once a month. Wife understands, she likes to do it at home, prescription provided.

## 2012-09-02 NOTE — Assessment & Plan Note (Signed)
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

## 2012-09-03 ENCOUNTER — Telehealth: Payer: Self-pay | Admitting: Internal Medicine

## 2012-09-03 ENCOUNTER — Encounter: Payer: Self-pay | Admitting: Internal Medicine

## 2012-09-03 NOTE — Addendum Note (Signed)
Addended by: Edwena Felty T on: 09/03/2012 03:27 PM   Modules accepted: Orders

## 2012-09-03 NOTE — Telephone Encounter (Signed)
Caller: [place]/Spouse; Phone: [phone]; Reason for Call: Caller reports pt was seen in the office on 09/02/12 for weight loss and abdominal pain.  Caller states Dr Drue Novel wants pt to have CT scan done.  Caller reports this am (09/03/12) pt has a knot in the RUQ of the abdomen.  (this is new since pt was seen) Pt states he has pain with movement (2/10) Caller wants to know if pt needs to go to get CT scan done today or what does Dr Drue Novel want pt to do?

## 2012-09-03 NOTE — Telephone Encounter (Signed)
Spoke to pt, advised the pt Dennis Combs is in the process of getting the CT scan approved by the pt's insurance.

## 2012-09-06 ENCOUNTER — Other Ambulatory Visit: Payer: Medicare PPO

## 2012-09-06 ENCOUNTER — Encounter: Payer: Self-pay | Admitting: *Deleted

## 2012-09-06 ENCOUNTER — Inpatient Hospital Stay: Admission: RE | Admit: 2012-09-06 | Payer: Medicare PPO | Source: Ambulatory Visit

## 2012-09-08 ENCOUNTER — Ambulatory Visit: Admission: RE | Admit: 2012-09-08 | Payer: Medicare PPO | Source: Ambulatory Visit

## 2012-09-08 ENCOUNTER — Ambulatory Visit (INDEPENDENT_AMBULATORY_CARE_PROVIDER_SITE_OTHER)
Admission: RE | Admit: 2012-09-08 | Discharge: 2012-09-08 | Disposition: A | Discharge status: Home / Self Care | Payer: Medicare PPO | Source: Ambulatory Visit | Attending: Internal Medicine | Admitting: Internal Medicine

## 2012-09-08 DIAGNOSIS — R6883 Chills (without fever): Secondary | ICD-10-CM

## 2012-09-09 ENCOUNTER — Other Ambulatory Visit: Payer: Self-pay | Admitting: *Deleted

## 2012-09-09 DIAGNOSIS — R634 Abnormal weight loss: Secondary | ICD-10-CM

## 2012-09-15 ENCOUNTER — Telehealth: Payer: Self-pay | Admitting: Internal Medicine

## 2012-09-15 NOTE — Telephone Encounter (Signed)
Pt was seen by Dr. Drue Novel on 6.12.14 & has a future appt with [facility]. Please advise.

## 2012-09-15 NOTE — Telephone Encounter (Signed)
Patient with high fever last night, 09/14/12, did not actually take his fever.  He took Tylenol and sweated the fever out.  Patient had diarrhea this morning, and has lost 4 more pounds.  Patient is aware of his appointment with [facility] in July.

## 2012-09-15 NOTE — Telephone Encounter (Signed)
There is a viral illness going around w/ fever, N/V/D.  If he continues to have symptoms, he will need an OV.  Otherwise, immodium for the diarrhea.  Tylenol for fever and body aches.  Lots of fluids and rest.

## 2012-09-15 NOTE — Telephone Encounter (Signed)
Pt made aware

## 2012-09-20 ENCOUNTER — Other Ambulatory Visit: Payer: Self-pay | Admitting: Internal Medicine

## 2012-09-20 NOTE — Telephone Encounter (Signed)
Ok to refill?  Last OV 09/02/12.  Last filled 09/02/12 #40 no refills. Confirmed with pt. Pharmacy pt. Picked up RX on 09/02/12. Pt. Does not have controlled substance contract on file.

## 2012-09-21 ENCOUNTER — Ambulatory Visit: Payer: Medicare PPO | Admitting: Family Medicine

## 2012-09-22 ENCOUNTER — Encounter: Payer: Self-pay | Admitting: Family Medicine

## 2012-09-22 ENCOUNTER — Ambulatory Visit (INDEPENDENT_AMBULATORY_CARE_PROVIDER_SITE_OTHER): Payer: Medicare PPO | Admitting: Family Medicine

## 2012-09-22 VITALS — BP 118/74 | HR 86 | Temp 98.1°F | Wt 146.8 lb

## 2012-09-22 DIAGNOSIS — R251 Tremor, unspecified: Secondary | ICD-10-CM

## 2012-09-22 DIAGNOSIS — R259 Unspecified abnormal involuntary movements: Secondary | ICD-10-CM

## 2012-09-22 DIAGNOSIS — R32 Unspecified urinary incontinence: Secondary | ICD-10-CM

## 2012-09-22 DIAGNOSIS — R300 Dysuria: Secondary | ICD-10-CM

## 2012-09-22 LAB — POCT URINALYSIS DIPSTICK
Spec Grav, UA: 1.015
Urobilinogen, UA: 0.2
pH, UA: 6.5

## 2012-09-22 MED ORDER — DIAZEPAM 2 MG PO TABS: 2.0000 mg | ORAL_TABLET | Freq: Four times a day (QID) | ORAL | Status: DC | PRN

## 2012-09-22 NOTE — Progress Notes (Signed)
  Subjective:    Patient ID: Dennis Combs, male    DOB: 1941-03-24, 72 y.o.   MRN: [id number]  HPI pt here with his wife c/o urinary incontinence. He is frustrated but states he knows it is not his prostate because "   He knows his body".  He also is having trouble with tremor and ran out of valium. He is also c/o knot on R ribs that come on suddenly.  He had some other concerns and I encouraged him to f/u with his pcp for the other things.  The urinary incontinence seems to be bothering him the most and the tremor.   Pt c/o not being able to control his bladder at all.   Review of Systems As above     Objective:   Physical Exam BP 118/74  Pulse 86  Temp(Src) 98.1 F (36.7 C) (Oral)  Wt 146 lb 12.8 oz (66.588 kg)  BMI 20.76 kg/m2  SpO2 98% General appearance: alert, cooperative, appears stated age and no distress Chest wall: no tenderness Abdomen: normal findings: soft, non-tender              Knot palpated on R low rib, non tender  Neuro-- _+ tremor in hands noticeable   see urine dip Assessment & Plan:

## 2012-09-22 NOTE — Patient Instructions (Addendum)
Urinary Incontinence Your doctor wants you to have this information about urinary incontinence. This is the inability to keep urine in your body until you decide to release it. CAUSES  Prostate gland enlargement is a common cause of urinary incontinence. But there are many different causes for losing urinary control. They include:  Medicines.  Infections.  Prostate problems.  Surgery.  Neurological diseases.  Emotional factors. DIAGNOSIS  Evaluating the cause of incontinence is important in choosing the best treatment. This may require:  An ultrasound exam.  Kidney and bladder X-rays.  Cystoscopy. This is an exam of the bladder using a narrow scope. TREATMENT  For incontinent patients, normal daily hygiene and using changing pads or adult diapers regularly will prevent offensive odors and skin damage from the moisture. Changing your medicines may help control incontinence. Your caregiver may prescribe some medicines to help you regain control. Avoid caffeine. It can over-stimulate the bladder. Use the bathroom regularly. Try about every 2 to 3 hours even if you do not feel the need. Take time to empty your bladder completely. After urinating, wait a minute. Then try to urinate again. External devices used to catch urine or an indwelling urine catheter (Foley catheter) may be needed as well. Some prostate gland problems require surgery to correct. Call your caregiver for more information. Document Released: 04/17/2004 Document Revised: 06/02/2011 Document Reviewed: 04/12/2008 [employer] Patient Information 2014 [employer].  

## 2012-09-22 NOTE — Assessment & Plan Note (Signed)
vesicare 5 and 10 mg samples given to try Pt did not want prostate check today--- refer to urology

## 2012-09-22 NOTE — Assessment & Plan Note (Signed)
Refill valium  F/u pcp-- pt prefers not to go back to neuro

## 2012-10-05 ENCOUNTER — Telehealth: Payer: Self-pay | Admitting: Internal Medicine

## 2012-10-05 NOTE — Telephone Encounter (Signed)
Spoke to referral coordinator, Corrie Dandy. She will research the referral and respond back.

## 2012-10-05 NOTE — Telephone Encounter (Signed)
Patient states that he received a letter from Dr. Larena Glassman office at [facility] (phn#:[phone]) sent him a letter stating that his visit on 10/08/12 needs authorization. He wants to make sure this has been taken care of before he goes.

## 2012-10-06 ENCOUNTER — Other Ambulatory Visit: Payer: Self-pay | Admitting: Family Medicine

## 2012-10-06 NOTE — Telephone Encounter (Signed)
Spoke with patient. Made him aware that our referral coordinator is looking into it and that Corrie Dandy would give him a call back this afternoon after she contacts his insurance company.

## 2012-10-07 NOTE — Telephone Encounter (Signed)
Rx prescribed on 09/22/12 by Dr.Lowne # 40. Please advise      KP

## 2012-10-07 NOTE — Telephone Encounter (Signed)
lmovm as well to return call.

## 2012-10-07 NOTE — Telephone Encounter (Signed)
Called Dr Paulino Door office at [facility], they send out a generic letter out to patients letting them know that if there insurance require a referral that it is pt responsibility to obtain one. Pt doesn't need referral. Called pt to inform, unable to contact pt .

## 2012-10-07 NOTE — Telephone Encounter (Signed)
Patient received Valium #40 on 09/22/2012. If we are going to keep prescribing such medication,   needs to come for a office visit. Also needs a contract and UDS.  Okay to refill #40, arrange a office visit.

## 2012-10-08 ENCOUNTER — Encounter: Payer: Self-pay | Admitting: *Deleted

## 2012-10-08 NOTE — Telephone Encounter (Signed)
Pt made aware rx & controlled substance contract is ready to be picked up at front desk.  

## 2012-10-08 NOTE — Telephone Encounter (Signed)
lmovm to return call. Multiple attempts to contact pt. To clarify his question. His appointment is today and no referral was needed per Dagoberto Reef referral coordinator. Will close encounter.

## 2012-10-29 ENCOUNTER — Encounter: Payer: Self-pay | Admitting: Internal Medicine

## 2012-10-29 ENCOUNTER — Other Ambulatory Visit: Payer: Self-pay | Admitting: Internal Medicine

## 2012-10-29 NOTE — Telephone Encounter (Signed)
Orders placed. Pt. Made aware rx ready for pickup. Front desk made aware of need for UDS.

## 2012-10-29 NOTE — Telephone Encounter (Signed)
Okay #40, no RF Needs UDS  at time of prescription pickup

## 2012-10-29 NOTE — Telephone Encounter (Signed)
Ok to refill valium? Last Ov 09/22/12 with Dr. Laury Axon (acute) Last OV 6.16.14 with Dr. Drue Novel (acute) No physical on file.  Last filled 7.2.14 #40 no refills No contract or UDS on file.

## 2012-11-16 ENCOUNTER — Other Ambulatory Visit: Payer: Self-pay | Admitting: Internal Medicine

## 2012-11-18 ENCOUNTER — Other Ambulatory Visit: Payer: Self-pay | Admitting: General Practice

## 2012-11-18 ENCOUNTER — Telehealth: Payer: Self-pay | Admitting: General Practice

## 2012-11-18 MED ORDER — DIAZEPAM 2 MG PO TABS: 2.0000 mg | ORAL_TABLET | Freq: Three times a day (TID) | ORAL | Status: DC | PRN

## 2012-11-18 MED ORDER — VERAPAMIL HCL ER 180 MG PO CP24: 180.0000 mg | ORAL_CAPSULE | Freq: Every day | ORAL | Status: DC

## 2012-11-18 NOTE — Telephone Encounter (Signed)
Pt called regarding his valium refill that had been refused on 11/16/2012. Pt states that he is completely out of his valium. Please advise as to what should be done?  Last OV 09/22/12 with [facility] last filled 10/29/12 #40 with 0 refills  Low Risk

## 2012-11-18 NOTE — Telephone Encounter (Signed)
Spoke with the patient, has been taking Valium 2 mg 3 times a day for tremors with good control of his symptoms and not excessive somnolence. I recommend to decrease the dose to 1 mg 3 times a day but he is extremely reluctant . Plan: Refill valium----please fax a Valium prescription to his pharmacy

## 2012-11-19 ENCOUNTER — Telehealth: Payer: Self-pay | Admitting: *Deleted

## 2012-11-19 MED ORDER — DIAZEPAM 2 MG PO TABS: 1.0000 mg | ORAL_TABLET | Freq: Three times a day (TID) | ORAL | Status: DC

## 2012-11-19 NOTE — Telephone Encounter (Signed)
rx refiiled-Valium 1mg  tid #90 refill 0  Next appt.12/03/12 [facility]

## 2012-11-19 NOTE — Telephone Encounter (Signed)
Rx for valium faxed to [facility] on [address]

## 2012-12-03 ENCOUNTER — Ambulatory Visit: Payer: Medicare PPO | Admitting: Internal Medicine

## 2014-04-08 ENCOUNTER — Encounter (HOSPITAL_COMMUNITY): Payer: Self-pay | Admitting: *Deleted

## 2014-04-08 ENCOUNTER — Emergency Department (HOSPITAL_COMMUNITY): Payer: Medicare PPO

## 2014-04-08 ENCOUNTER — Emergency Department (HOSPITAL_COMMUNITY)
Admission: EM | Admit: 2014-04-08 | Discharge: 2014-04-08 | Disposition: A | Discharge status: Home / Self Care | Payer: Medicare PPO | Attending: Emergency Medicine | Admitting: Emergency Medicine

## 2014-04-08 DIAGNOSIS — Y998 Other external cause status: Secondary | ICD-10-CM | POA: Insufficient documentation

## 2014-04-08 DIAGNOSIS — Y92410 Unspecified street and highway as the place of occurrence of the external cause: Secondary | ICD-10-CM | POA: Diagnosis not present

## 2014-04-08 DIAGNOSIS — J449 Chronic obstructive pulmonary disease, unspecified: Secondary | ICD-10-CM | POA: Insufficient documentation

## 2014-04-08 DIAGNOSIS — Z7982 Long term (current) use of aspirin: Secondary | ICD-10-CM | POA: Insufficient documentation

## 2014-04-08 DIAGNOSIS — Y9389 Activity, other specified: Secondary | ICD-10-CM | POA: Diagnosis not present

## 2014-04-08 DIAGNOSIS — Z23 Encounter for immunization: Secondary | ICD-10-CM | POA: Insufficient documentation

## 2014-04-08 DIAGNOSIS — S299XXA Unspecified injury of thorax, initial encounter: Secondary | ICD-10-CM | POA: Diagnosis not present

## 2014-04-08 DIAGNOSIS — E860 Dehydration: Secondary | ICD-10-CM | POA: Diagnosis not present

## 2014-04-08 DIAGNOSIS — Z720 Tobacco use: Secondary | ICD-10-CM | POA: Diagnosis not present

## 2014-04-08 DIAGNOSIS — R52 Pain, unspecified: Secondary | ICD-10-CM

## 2014-04-08 DIAGNOSIS — Z8711 Personal history of peptic ulcer disease: Secondary | ICD-10-CM | POA: Insufficient documentation

## 2014-04-08 DIAGNOSIS — S60512A Abrasion of left hand, initial encounter: Secondary | ICD-10-CM | POA: Insufficient documentation

## 2014-04-08 DIAGNOSIS — Z86010 Personal history of colonic polyps: Secondary | ICD-10-CM | POA: Insufficient documentation

## 2014-04-08 DIAGNOSIS — F0391 Unspecified dementia with behavioral disturbance: Secondary | ICD-10-CM | POA: Diagnosis not present

## 2014-04-08 DIAGNOSIS — F419 Anxiety disorder, unspecified: Secondary | ICD-10-CM | POA: Insufficient documentation

## 2014-04-08 DIAGNOSIS — S6991XA Unspecified injury of right wrist, hand and finger(s), initial encounter: Secondary | ICD-10-CM | POA: Diagnosis present

## 2014-04-08 DIAGNOSIS — I2510 Atherosclerotic heart disease of native coronary artery without angina pectoris: Secondary | ICD-10-CM | POA: Diagnosis not present

## 2014-04-08 DIAGNOSIS — Z8701 Personal history of pneumonia (recurrent): Secondary | ICD-10-CM | POA: Diagnosis not present

## 2014-04-08 DIAGNOSIS — S60511A Abrasion of right hand, initial encounter: Secondary | ICD-10-CM | POA: Diagnosis not present

## 2014-04-08 DIAGNOSIS — Z79899 Other long term (current) drug therapy: Secondary | ICD-10-CM | POA: Diagnosis not present

## 2014-04-08 DIAGNOSIS — Z8719 Personal history of other diseases of the digestive system: Secondary | ICD-10-CM | POA: Insufficient documentation

## 2014-04-08 DIAGNOSIS — S60419A Abrasion of unspecified finger, initial encounter: Secondary | ICD-10-CM

## 2014-04-08 DIAGNOSIS — S60519A Abrasion of unspecified hand, initial encounter: Secondary | ICD-10-CM

## 2014-04-08 LAB — DIFFERENTIAL
Basophils Absolute: 0 10*3/uL (ref 0.0–0.1)
Basophils Relative: 0 % (ref 0–1)
Eosinophils Absolute: 0 10*3/uL (ref 0.0–0.7)
Eosinophils Relative: 0 % (ref 0–5)
LYMPHS ABS: 0.9 10*3/uL (ref 0.7–4.0)
Lymphocytes Relative: 8 % — ABNORMAL LOW (ref 12–46)
Monocytes Absolute: 0.9 10*3/uL (ref 0.1–1.0)
Monocytes Relative: 8 % (ref 3–12)
NEUTROS PCT: 84 % — AB (ref 43–77)
Neutro Abs: 9.6 10*3/uL — ABNORMAL HIGH (ref 1.7–7.7)

## 2014-04-08 LAB — COMPREHENSIVE METABOLIC PANEL
ALT: 11 U/L (ref 0–53)
AST: 28 U/L (ref 0–37)
Albumin: 4.3 g/dL (ref 3.5–5.2)
Alkaline Phosphatase: 76 U/L (ref 39–117)
Anion gap: 14 (ref 5–15)
BUN: 14 mg/dL (ref 6–23)
CHLORIDE: 105 meq/L (ref 96–112)
CO2: 21 mmol/L (ref 19–32)
CREATININE: 0.93 mg/dL (ref 0.50–1.35)
Calcium: 9.4 mg/dL (ref 8.4–10.5)
GFR calc Af Amer: >90 mL/min (ref >90)
GFR calc non Af Amer: 81 mL/min — ABNORMAL LOW (ref >90)
Glucose, Bld: 109 mg/dL — ABNORMAL HIGH (ref 70–99)
Potassium: 4.2 mmol/L (ref 3.5–5.1)
Sodium: 140 mmol/L (ref 135–145)
Total Bilirubin: 2.1 mg/dL — ABNORMAL HIGH (ref 0.3–1.2)
Total Protein: 7.1 g/dL (ref 6.0–8.3)

## 2014-04-08 LAB — CBC
HCT: 43 % (ref 39.0–52.0)
HEMOGLOBIN: 14.7 g/dL (ref 13.0–17.0)
MCH: 29.9 pg (ref 26.0–34.0)
MCHC: 34.2 g/dL (ref 30.0–36.0)
MCV: 87.6 fL (ref 78.0–100.0)
Platelets: 250 10*3/uL (ref 150–400)
RBC: 4.91 MIL/uL (ref 4.22–5.81)
RDW: 14.1 % (ref 11.5–15.5)
WBC: 11.5 10*3/uL — AB (ref 4.0–10.5)

## 2014-04-08 LAB — I-STAT CG4 LACTIC ACID, ED: Lactic Acid, Venous: 2.38 mmol/L — ABNORMAL HIGH (ref 0.5–2.2)

## 2014-04-08 LAB — APTT: aPTT: 28 seconds (ref 24–37)

## 2014-04-08 LAB — CBG MONITORING, ED: GLUCOSE-CAPILLARY: 99 mg/dL (ref 70–99)

## 2014-04-08 LAB — I-STAT TROPONIN, ED: Troponin i, poc: 0 ng/mL (ref 0.00–0.08)

## 2014-04-08 LAB — PROTIME-INR
INR: 1.08 (ref 0.00–1.49)
Prothrombin Time: 14.2 seconds (ref 11.6–15.2)

## 2014-04-08 LAB — CK: Total CK: 124 U/L (ref 7–232)

## 2014-04-08 LAB — ETHANOL

## 2014-04-08 MED ORDER — TETANUS-DIPHTH-ACELL PERTUSSIS 5-2.5-18.5 LF-MCG/0.5 IM SUSP
0.5000 mL | Freq: Once | INTRAMUSCULAR | Status: AC
  Administered 2014-04-08: 0.5 mL via INTRAMUSCULAR
  Filled 2014-04-08: qty 0.5

## 2014-04-08 MED ORDER — SODIUM CHLORIDE 0.9 % IV BOLUS (SEPSIS)
500.0000 mL | Freq: Once | INTRAVENOUS | Status: AC
  Administered 2014-04-08: 500 mL via INTRAVENOUS

## 2014-04-08 MED ORDER — ASPIRIN 81 MG PO CHEW: 81.0000 mg | CHEWABLE_TABLET | Freq: Every day | ORAL | Status: DC

## 2014-04-08 MED ORDER — SODIUM CHLORIDE 0.9 % IV BOLUS (SEPSIS)
1000.0000 mL | Freq: Once | INTRAVENOUS | Status: AC
  Administered 2014-04-08: 1000 mL via INTRAVENOUS

## 2014-04-08 MED ORDER — RISPERIDONE 0.5 MG PO TABS: 1.0000 mg | ORAL_TABLET | Freq: Every day | ORAL | Status: DC

## 2014-04-08 MED ORDER — LORAZEPAM 1 MG PO TABS: 1.0000 mg | ORAL_TABLET | ORAL | Status: DC | PRN

## 2014-04-08 MED ORDER — LORAZEPAM 2 MG/ML IJ SOLN
1.0000 mg | Freq: Once | INTRAMUSCULAR | Status: AC
  Administered 2014-04-08: 1 mg via INTRAVENOUS
  Filled 2014-04-08: qty 1

## 2014-04-08 MED ORDER — FOLIC ACID 1 MG PO TABS: 1.0000 mg | ORAL_TABLET | Freq: Every day | ORAL | Status: DC

## 2014-04-08 MED ORDER — DIAZEPAM 2 MG PO TABS: 1.0000 mg | ORAL_TABLET | Freq: Three times a day (TID) | ORAL | Status: DC

## 2014-04-08 NOTE — Discharge Instructions (Signed)
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
## 2014-04-08 NOTE — Progress Notes (Signed)
  CARE MANAGEMENT ED NOTE 04/08/2014  Patient:  Dennis Combs, Dennis Combs   Account Number:  [id number]  Date Initiated:  04/08/2014  Documentation initiated by:  [facility]  Subjective/Objective Assessment:   Presented to [facility] ED AMS, s/p fall     Subjective/Objective Assessment Detail:     Action/Plan:   [place]   Action/Plan Detail:   Anticipated DC Date:  04/08/2014     Status Recommendation to Physician:   Result of Recommendation:  Agreed  Other ED Services  Consult Working Plan   In-house referral  Clinical Social Worker   DC Forensic scientist  CM consult   [facility] Choice  HOME HEALTH   Choice offered to / List presented to:  C-3 Spouse     HH arranged  HH-1 RN  [place]      Hominy agency  [place]    Status of service:  Completed, signed off  ED Comments:   ED Comments Detail:  ED CM consulted by EDP on Pod A concerning [place] needs. Patient presented to [facility] ED with Altered mental status. CM reviewed record,  Dr. Larose Kells PCP/ [facility] Medicare health coverage.CM and CSW  Met with patient's wife and family in consultation room A. Patient lives at home with wife primary caregiver, it has become  difficult to manage him at home, as per wife. Patient has become more confused,with wandering. Wife reports he has had a  loss of appetite, weakness, and several falls recently. Discussed the EDP recommendations for [facility] RN,PT/OT/HHA/SW, family are agreeable.  Offered choice Provided [place] list, family selected [place].Patient ambulates with walkerconfirmed patient has DME at home. Verified address and phone number where [facility] services will be rendered. Referral faxed to [place] [phone]. Received fax confirmation. Expalined that a representative from [place] will contact her within 24-48 at the best contact number. Wife and family verbalized understanding and appreciation for the assistance. Provided  my contact  information should any further question or concerns should arise.  Family was also given information about DSS.  No further ED CM needs identified.

## 2014-04-08 NOTE — ED Notes (Signed)
Family talking w/Wanda, CM, and [place], SW
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
## 2014-04-08 NOTE — ED Notes (Signed)
Dr Christy Gentles given a copy of lactic acid results 2.38
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
## 2014-04-09 NOTE — Progress Notes (Signed)
ED CM contacted patient yesterday in regards to switching to another [place] declined the case. Wife selected [facility] referral was faxed to [facility] [phone], received fax confirmation.

## 2014-05-01 ENCOUNTER — Telehealth: Payer: Self-pay | Admitting: *Deleted

## 2014-05-01 NOTE — Telephone Encounter (Signed)
Received home health certification and plan of care via fax from [place]. Forwarded to Dr. Larose Kells. JG//CMA

## 2014-05-02 ENCOUNTER — Telehealth: Payer: Self-pay | Admitting: *Deleted

## 2014-05-02 NOTE — Telephone Encounter (Signed)
Signed paperwork faxed to [place] successfully. JG//CMA

## 2014-05-02 NOTE — Telephone Encounter (Signed)
Opened in error

## 2014-05-02 NOTE — Telephone Encounter (Signed)
Correction to previous message below on 05/02/13 @ 1:19 pm. Forms were not signed by Dr. Larose Kells because patient has not had a face-to-face encounter with him since 2014. This was noted on forms and faxed to [place]. JG//CMA

## 2014-10-15 ENCOUNTER — Encounter (HOSPITAL_COMMUNITY): Payer: Self-pay | Admitting: Nurse Practitioner

## 2014-10-15 ENCOUNTER — Emergency Department (HOSPITAL_COMMUNITY): Payer: Medicare PPO

## 2014-10-15 ENCOUNTER — Emergency Department (HOSPITAL_COMMUNITY)
Admission: EM | Admit: 2014-10-15 | Discharge: 2014-10-15 | Disposition: A | Discharge status: Home / Self Care | Payer: Medicare PPO | Attending: Emergency Medicine | Admitting: Emergency Medicine

## 2014-10-15 DIAGNOSIS — Z720 Tobacco use: Secondary | ICD-10-CM | POA: Insufficient documentation

## 2014-10-15 DIAGNOSIS — I2510 Atherosclerotic heart disease of native coronary artery without angina pectoris: Secondary | ICD-10-CM | POA: Insufficient documentation

## 2014-10-15 DIAGNOSIS — Z86010 Personal history of colonic polyps: Secondary | ICD-10-CM | POA: Diagnosis not present

## 2014-10-15 DIAGNOSIS — Z7982 Long term (current) use of aspirin: Secondary | ICD-10-CM | POA: Diagnosis not present

## 2014-10-15 DIAGNOSIS — Z79899 Other long term (current) drug therapy: Secondary | ICD-10-CM | POA: Insufficient documentation

## 2014-10-15 DIAGNOSIS — F419 Anxiety disorder, unspecified: Secondary | ICD-10-CM | POA: Diagnosis not present

## 2014-10-15 DIAGNOSIS — Z8719 Personal history of other diseases of the digestive system: Secondary | ICD-10-CM | POA: Diagnosis not present

## 2014-10-15 DIAGNOSIS — F0391 Unspecified dementia with behavioral disturbance: Secondary | ICD-10-CM | POA: Diagnosis not present

## 2014-10-15 DIAGNOSIS — Z8701 Personal history of pneumonia (recurrent): Secondary | ICD-10-CM | POA: Insufficient documentation

## 2014-10-15 DIAGNOSIS — J449 Chronic obstructive pulmonary disease, unspecified: Secondary | ICD-10-CM | POA: Insufficient documentation

## 2014-10-15 DIAGNOSIS — Z872 Personal history of diseases of the skin and subcutaneous tissue: Secondary | ICD-10-CM | POA: Diagnosis not present

## 2014-10-15 HISTORY — DX: Unspecified dementia, unspecified severity, without behavioral disturbance, psychotic disturbance, mood disturbance, and anxiety: F03.90

## 2014-10-15 LAB — URINALYSIS, ROUTINE W REFLEX MICROSCOPIC
BILIRUBIN URINE: NEGATIVE
GLUCOSE, UA: NEGATIVE mg/dL
Hgb urine dipstick: NEGATIVE
Ketones, ur: NEGATIVE mg/dL
LEUKOCYTES UA: NEGATIVE
Nitrite: NEGATIVE
PH: 5 (ref 5.0–8.0)
PROTEIN: NEGATIVE mg/dL
Specific Gravity, Urine: 1.02 (ref 1.005–1.030)
Urobilinogen, UA: 0.2 mg/dL (ref 0.0–1.0)

## 2014-10-15 LAB — BASIC METABOLIC PANEL
Anion gap: 5 (ref 5–15)
BUN: 13 mg/dL (ref 6–20)
CO2: 28 mmol/L (ref 22–32)
CREATININE: 0.95 mg/dL (ref 0.61–1.24)
Calcium: 8.6 mg/dL — ABNORMAL LOW (ref 8.9–10.3)
Chloride: 108 mmol/L (ref 101–111)
GFR calc non Af Amer: >60 mL/min (ref >60)
Glucose, Bld: 103 mg/dL — ABNORMAL HIGH (ref 65–99)
POTASSIUM: 4 mmol/L (ref 3.5–5.1)
SODIUM: 141 mmol/L (ref 135–145)

## 2014-10-15 LAB — CBC
HEMATOCRIT: 42.4 % (ref 39.0–52.0)
Hemoglobin: 13.8 g/dL (ref 13.0–17.0)
MCH: 29.6 pg (ref 26.0–34.0)
MCHC: 32.5 g/dL (ref 30.0–36.0)
MCV: 90.8 fL (ref 78.0–100.0)
PLATELETS: 179 10*3/uL (ref 150–400)
RBC: 4.67 MIL/uL (ref 4.22–5.81)
RDW: 14.4 % (ref 11.5–15.5)
WBC: 6.3 10*3/uL (ref 4.0–10.5)

## 2014-10-15 LAB — I-STAT TROPONIN, ED: TROPONIN I, POC: 0 ng/mL (ref 0.00–0.08)

## 2014-10-15 LAB — RAPID URINE DRUG SCREEN, HOSP PERFORMED
Amphetamines: NOT DETECTED
BARBITURATES: NOT DETECTED
Benzodiazepines: NOT DETECTED
Cocaine: NOT DETECTED
OPIATES: NOT DETECTED
Tetrahydrocannabinol: NOT DETECTED

## 2014-10-15 LAB — TROPONIN I: Troponin I: <0.03 ng/mL (ref <0.031)

## 2014-10-15 MED ORDER — VERAPAMIL HCL ER 120 MG PO CP24: 120.0000 mg | ORAL_CAPSULE | Freq: Every day | ORAL | Status: DC

## 2014-10-15 MED ORDER — QUETIAPINE FUMARATE 25 MG PO TABS: 50.0000 mg | ORAL_TABLET | Freq: Two times a day (BID) | ORAL | Status: DC

## 2014-10-15 NOTE — Discharge Instructions (Signed)

## 2014-10-15 NOTE — ED Notes (Signed)
Pt sons brought him because hes been c/o palpitations and unable to sleep for past few nights. He has dementia and normally his wife is his caregiver but she is now hospitalized and the sons are concerned for the pt safety. The pt is alert and breathing easily, denies complaints
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
## 2014-10-15 NOTE — ED Provider Notes (Signed)
Social worker has advised they will not be able to find placement tonight. They will review options with family.  Charlesetta Shanks, MD 10/15/14 [phone]
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
## 2014-10-15 NOTE — Progress Notes (Signed)
Pt will not be admitted to hospital. Cannot be placed in SNF as he has no current medical condition which qualifies him for any type of skilled care. CSW/CM worked to find resources for adult children to be able to take their father back home and begin the process of finding [place] in the community. Suggested Care Patrol, and [employer], also given booklets with information on Home Care. Children expressed understanding and appreciation for resources. Encouraged to use any and all resources such as PCP which is the [employer]. No other CM needs at present.
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
## 2016-04-14 ENCOUNTER — Encounter: Payer: Self-pay | Admitting: *Deleted

## 2016-04-30 ENCOUNTER — Encounter: Payer: Self-pay | Admitting: Internal Medicine

## 2016-07-16 ENCOUNTER — Observation Stay (HOSPITAL_COMMUNITY): Payer: Medicare PPO

## 2016-07-16 ENCOUNTER — Encounter (HOSPITAL_COMMUNITY): Payer: Self-pay | Admitting: Emergency Medicine

## 2016-07-16 ENCOUNTER — Emergency Department (HOSPITAL_COMMUNITY): Payer: Medicare PPO

## 2016-07-16 ENCOUNTER — Inpatient Hospital Stay (HOSPITAL_COMMUNITY)
Admission: EM | Admit: 2016-07-16 | Discharge: 2016-07-19 | DRG: 070 | Disposition: A | Discharge status: Home Health | Payer: Medicare PPO | Attending: Internal Medicine | Admitting: Internal Medicine

## 2016-07-16 DIAGNOSIS — Z86010 Personal history of colonic polyps: Secondary | ICD-10-CM

## 2016-07-16 DIAGNOSIS — K219 Gastro-esophageal reflux disease without esophagitis: Secondary | ICD-10-CM | POA: Diagnosis present

## 2016-07-16 DIAGNOSIS — R1110 Vomiting, unspecified: Secondary | ICD-10-CM

## 2016-07-16 DIAGNOSIS — G3109 Other frontotemporal dementia: Secondary | ICD-10-CM | POA: Diagnosis present

## 2016-07-16 DIAGNOSIS — Z8701 Personal history of pneumonia (recurrent): Secondary | ICD-10-CM

## 2016-07-16 DIAGNOSIS — R4182 Altered mental status, unspecified: Secondary | ICD-10-CM | POA: Diagnosis not present

## 2016-07-16 DIAGNOSIS — G9340 Encephalopathy, unspecified: Secondary | ICD-10-CM | POA: Diagnosis not present

## 2016-07-16 DIAGNOSIS — R569 Unspecified convulsions: Secondary | ICD-10-CM

## 2016-07-16 DIAGNOSIS — G0490 Encephalitis and encephalomyelitis, unspecified: Secondary | ICD-10-CM | POA: Diagnosis present

## 2016-07-16 DIAGNOSIS — F0390 Unspecified dementia without behavioral disturbance: Secondary | ICD-10-CM | POA: Diagnosis present

## 2016-07-16 DIAGNOSIS — R000 Tachycardia, unspecified: Secondary | ICD-10-CM | POA: Diagnosis present

## 2016-07-16 DIAGNOSIS — E785 Hyperlipidemia, unspecified: Secondary | ICD-10-CM | POA: Diagnosis present

## 2016-07-16 DIAGNOSIS — I10 Essential (primary) hypertension: Secondary | ICD-10-CM | POA: Diagnosis present

## 2016-07-16 DIAGNOSIS — F0280 Dementia in other diseases classified elsewhere without behavioral disturbance: Secondary | ICD-10-CM | POA: Diagnosis present

## 2016-07-16 DIAGNOSIS — I2510 Atherosclerotic heart disease of native coronary artery without angina pectoris: Secondary | ICD-10-CM | POA: Diagnosis not present

## 2016-07-16 DIAGNOSIS — R4701 Aphasia: Secondary | ICD-10-CM | POA: Diagnosis present

## 2016-07-16 DIAGNOSIS — F419 Anxiety disorder, unspecified: Secondary | ICD-10-CM | POA: Diagnosis present

## 2016-07-16 DIAGNOSIS — Z8711 Personal history of peptic ulcer disease: Secondary | ICD-10-CM

## 2016-07-16 DIAGNOSIS — J449 Chronic obstructive pulmonary disease, unspecified: Secondary | ICD-10-CM | POA: Diagnosis present

## 2016-07-16 DIAGNOSIS — Z7982 Long term (current) use of aspirin: Secondary | ICD-10-CM

## 2016-07-16 DIAGNOSIS — R400 Somnolence: Secondary | ICD-10-CM

## 2016-07-16 DIAGNOSIS — F17210 Nicotine dependence, cigarettes, uncomplicated: Secondary | ICD-10-CM | POA: Diagnosis present

## 2016-07-16 DIAGNOSIS — Z885 Allergy status to narcotic agent status: Secondary | ICD-10-CM

## 2016-07-16 DIAGNOSIS — E1143 Type 2 diabetes mellitus with diabetic autonomic (poly)neuropathy: Secondary | ICD-10-CM | POA: Diagnosis present

## 2016-07-16 DIAGNOSIS — Z79899 Other long term (current) drug therapy: Secondary | ICD-10-CM

## 2016-07-16 DIAGNOSIS — R6510 Systemic inflammatory response syndrome (SIRS) of non-infectious origin without acute organ dysfunction: Secondary | ICD-10-CM

## 2016-07-16 DIAGNOSIS — Z91041 Radiographic dye allergy status: Secondary | ICD-10-CM

## 2016-07-16 HISTORY — DX: Systemic inflammatory response syndrome (sirs) of non-infectious origin without acute organ dysfunction: R65.10

## 2016-07-16 LAB — I-STAT CG4 LACTIC ACID, ED
LACTIC ACID, VENOUS: 2.37 mmol/L — AB (ref 0.5–1.9)
Lactic Acid, Venous: 2.54 mmol/L (ref 0.5–1.9)

## 2016-07-16 LAB — MAGNESIUM: MAGNESIUM: 2.1 mg/dL (ref 1.7–2.4)

## 2016-07-16 LAB — URINALYSIS, ROUTINE W REFLEX MICROSCOPIC
BACTERIA UA: NONE SEEN
Bilirubin Urine: NEGATIVE
GLUCOSE, UA: NEGATIVE mg/dL
KETONES UR: 20 mg/dL — AB
LEUKOCYTES UA: NEGATIVE
NITRITE: NEGATIVE
PROTEIN: 30 mg/dL — AB
RBC / HPF: NONE SEEN RBC/hpf (ref 0–5)
Specific Gravity, Urine: 1.025 (ref 1.005–1.030)
Squamous Epithelial / LPF: NONE SEEN
pH: 5 (ref 5.0–8.0)

## 2016-07-16 LAB — CSF CELL COUNT WITH DIFFERENTIAL
RBC Count, CSF: 1645 /mm3 — ABNORMAL HIGH
RBC Count, CSF: 9 /mm3 — ABNORMAL HIGH
TUBE #: 1
TUBE #: 4
WBC CSF: 9 /mm3 — AB (ref 0–5)
WBC, CSF: 9 /mm3 — ABNORMAL HIGH (ref 0–5)

## 2016-07-16 LAB — CBC WITH DIFFERENTIAL/PLATELET
BASOS PCT: 0 %
Basophils Absolute: 0 10*3/uL (ref 0.0–0.1)
EOS ABS: 0 10*3/uL (ref 0.0–0.7)
EOS PCT: 0 %
HCT: 44.8 % (ref 39.0–52.0)
Hemoglobin: 14.4 g/dL (ref 13.0–17.0)
LYMPHS ABS: 0.7 10*3/uL (ref 0.7–4.0)
LYMPHS PCT: 5 %
MCH: 27.9 pg (ref 26.0–34.0)
MCHC: 32.1 g/dL (ref 30.0–36.0)
MCV: 86.7 fL (ref 78.0–100.0)
Monocytes Absolute: 0.8 10*3/uL (ref 0.1–1.0)
Monocytes Relative: 6 %
NEUTROS ABS: 12 10*3/uL — AB (ref 1.7–7.7)
Neutrophils Relative %: 89 %
PLATELETS: 300 10*3/uL (ref 150–400)
RBC: 5.17 MIL/uL (ref 4.22–5.81)
RDW: 14.2 % (ref 11.5–15.5)
WBC: 13.6 10*3/uL — AB (ref 4.0–10.5)

## 2016-07-16 LAB — COMPREHENSIVE METABOLIC PANEL
ALBUMIN: 3.9 g/dL (ref 3.5–5.0)
ALT: 14 U/L — ABNORMAL LOW (ref 17–63)
ALT: 15 U/L — ABNORMAL LOW (ref 17–63)
ANION GAP: 8 (ref 5–15)
AST: 26 U/L (ref 15–41)
AST: 23 U/L (ref 15–41)
Albumin: 3.3 g/dL — ABNORMAL LOW (ref 3.5–5.0)
Alkaline Phosphatase: 84 U/L (ref 38–126)
Alkaline Phosphatase: 72 U/L (ref 38–126)
Anion gap: 13 (ref 5–15)
BILIRUBIN TOTAL: 1.3 mg/dL — AB (ref 0.3–1.2)
BUN: 15 mg/dL (ref 6–20)
BUN: 14 mg/dL (ref 6–20)
CHLORIDE: 103 mmol/L (ref 101–111)
CHLORIDE: 109 mmol/L (ref 101–111)
CO2: 23 mmol/L (ref 22–32)
CO2: 22 mmol/L (ref 22–32)
Calcium: 9.5 mg/dL (ref 8.9–10.3)
Calcium: 8.4 mg/dL — ABNORMAL LOW (ref 8.9–10.3)
Creatinine, Ser: 1.08 mg/dL (ref 0.61–1.24)
Creatinine, Ser: 0.97 mg/dL (ref 0.61–1.24)
GFR calc Af Amer: >60 mL/min (ref >60)
GLUCOSE: 148 mg/dL — AB (ref 65–99)
Glucose, Bld: 199 mg/dL — ABNORMAL HIGH (ref 65–99)
POTASSIUM: 3.8 mmol/L (ref 3.5–5.1)
Potassium: 3.9 mmol/L (ref 3.5–5.1)
SODIUM: 139 mmol/L (ref 135–145)
Sodium: 139 mmol/L (ref 135–145)
TOTAL PROTEIN: 6.2 g/dL — AB (ref 6.5–8.1)
Total Bilirubin: 1.3 mg/dL — ABNORMAL HIGH (ref 0.3–1.2)
Total Protein: 7.2 g/dL (ref 6.5–8.1)

## 2016-07-16 LAB — CBC
HCT: 40.7 % (ref 39.0–52.0)
Hemoglobin: 13.5 g/dL (ref 13.0–17.0)
MCH: 28.7 pg (ref 26.0–34.0)
MCHC: 33.2 g/dL (ref 30.0–36.0)
MCV: 86.4 fL (ref 78.0–100.0)
PLATELETS: 269 10*3/uL (ref 150–400)
RBC: 4.71 MIL/uL (ref 4.22–5.81)
RDW: 13.9 % (ref 11.5–15.5)
WBC: 16.1 10*3/uL — ABNORMAL HIGH (ref 4.0–10.5)

## 2016-07-16 LAB — GLUCOSE, CSF: GLUCOSE CSF: 68 mg/dL (ref 40–70)

## 2016-07-16 LAB — I-STAT TROPONIN, ED: Troponin i, poc: 0 ng/mL (ref 0.00–0.08)

## 2016-07-16 LAB — VITAMIN B12: VITAMIN B 12: 320 pg/mL (ref 180–914)

## 2016-07-16 LAB — PROTEIN, CSF: Total  Protein, CSF: 84 mg/dL — ABNORMAL HIGH (ref 15–45)

## 2016-07-16 LAB — TSH: TSH: 4.612 u[IU]/mL — AB (ref 0.350–4.500)

## 2016-07-16 MED ORDER — LIDOCAINE HCL (PF) 1 % IJ SOLN
30.0000 mL | Freq: Once | INTRAMUSCULAR | Status: AC
  Administered 2016-07-16: 30 mL via INTRADERMAL
  Filled 2016-07-16: qty 30

## 2016-07-16 MED ORDER — ENOXAPARIN SODIUM 40 MG/0.4ML ~~LOC~~ SOLN
40.0000 mg | SUBCUTANEOUS | Status: DC
  Administered 2016-07-16 – 2016-07-18 (×3): 40 mg via SUBCUTANEOUS
  Filled 2016-07-16 (×3): qty 0.4

## 2016-07-16 MED ORDER — SODIUM CHLORIDE 0.9 % IV SOLN
1250.0000 mg | INTRAVENOUS | Status: DC
  Administered 2016-07-17: 1250 mg via INTRAVENOUS
  Filled 2016-07-16: qty 1250

## 2016-07-16 MED ORDER — VERAPAMIL HCL ER 180 MG PO CP24: 180.0000 mg | ORAL_CAPSULE | Freq: Every day | ORAL | Status: DC

## 2016-07-16 MED ORDER — ASPIRIN 81 MG PO CHEW
81.0000 mg | CHEWABLE_TABLET | Freq: Every day | ORAL | Status: DC
  Administered 2016-07-18 – 2016-07-19 (×2): 81 mg via ORAL
  Filled 2016-07-16 (×2): qty 1

## 2016-07-16 MED ORDER — LEVETIRACETAM 500 MG PO TABS: 500.0000 mg | ORAL_TABLET | Freq: Two times a day (BID) | ORAL | Status: DC

## 2016-07-16 MED ORDER — ACETAMINOPHEN 325 MG PO TABS: 650.0000 mg | ORAL_TABLET | Freq: Four times a day (QID) | ORAL | Status: DC | PRN

## 2016-07-16 MED ORDER — SODIUM CHLORIDE 0.9% FLUSH: 3.0000 mL | Freq: Two times a day (BID) | INTRAVENOUS | Status: DC

## 2016-07-16 MED ORDER — LORAZEPAM 2 MG/ML IJ SOLN
0.5000 mg | Freq: Once | INTRAMUSCULAR | Status: AC
  Administered 2016-07-16: 0.5 mg via INTRAVENOUS

## 2016-07-16 MED ORDER — ONDANSETRON HCL 4 MG/2ML IJ SOLN
4.0000 mg | Freq: Four times a day (QID) | INTRAMUSCULAR | Status: DC | PRN
  Administered 2016-07-18: 4 mg via INTRAVENOUS
  Filled 2016-07-16: qty 2

## 2016-07-16 MED ORDER — DEXTROSE 5 % IV SOLN: 1.0000 g | Freq: Once | INTRAVENOUS | Status: DC

## 2016-07-16 MED ORDER — FOLIC ACID 1 MG PO TABS
1.0000 mg | ORAL_TABLET | Freq: Every day | ORAL | Status: DC
  Administered 2016-07-18 – 2016-07-19 (×2): 1 mg via ORAL
  Filled 2016-07-16 (×2): qty 1

## 2016-07-16 MED ORDER — SODIUM CHLORIDE 0.9 % IV SOLN
250.0000 mL | INTRAVENOUS | Status: DC | PRN
Start: 2016-07-16 — End: 2016-07-19

## 2016-07-16 MED ORDER — SODIUM CHLORIDE 0.9 % IV SOLN
500.0000 mg | Freq: Two times a day (BID) | INTRAVENOUS | Status: DC
Start: 2016-07-16 — End: 2016-07-17
  Administered 2016-07-17 (×2): 500 mg via INTRAVENOUS
  Filled 2016-07-16 (×3): qty 5

## 2016-07-16 MED ORDER — SODIUM CHLORIDE 0.9 % IV SOLN
INTRAVENOUS | Status: DC
  Administered 2016-07-16 – 2016-07-19 (×4): via INTRAVENOUS

## 2016-07-16 MED ORDER — VANCOMYCIN HCL IN DEXTROSE 1-5 GM/200ML-% IV SOLN
1000.0000 mg | Freq: Once | INTRAVENOUS | Status: AC
  Administered 2016-07-16: 1000 mg via INTRAVENOUS
  Filled 2016-07-16: qty 200

## 2016-07-16 MED ORDER — ONDANSETRON HCL 4 MG PO TABS: 4.0000 mg | ORAL_TABLET | Freq: Four times a day (QID) | ORAL | Status: DC | PRN

## 2016-07-16 MED ORDER — PIPERACILLIN-TAZOBACTAM 3.375 G IVPB
3.3750 g | Freq: Three times a day (TID) | INTRAVENOUS | Status: DC
  Administered 2016-07-17: 3.375 g via INTRAVENOUS
  Filled 2016-07-16 (×2): qty 50

## 2016-07-16 MED ORDER — SODIUM CHLORIDE 0.9% FLUSH
3.0000 mL | Freq: Two times a day (BID) | INTRAVENOUS | Status: DC
  Administered 2016-07-17 – 2016-07-18 (×2): 3 mL via INTRAVENOUS

## 2016-07-16 MED ORDER — PIPERACILLIN-TAZOBACTAM 3.375 G IVPB 30 MIN
3.3750 g | Freq: Once | INTRAVENOUS | Status: AC
  Administered 2016-07-16: 3.375 g via INTRAVENOUS
  Filled 2016-07-16: qty 50

## 2016-07-16 MED ORDER — LIDOCAINE HCL (CARDIAC) 20 MG/ML IV SOLN
INTRAVENOUS | Status: AC
  Filled 2016-07-16: qty 5

## 2016-07-16 MED ORDER — DIAZEPAM 2 MG PO TABS
1.0000 mg | ORAL_TABLET | Freq: Three times a day (TID) | ORAL | Status: DC
  Administered 2016-07-17 – 2016-07-19 (×5): 1 mg via ORAL
  Filled 2016-07-16 (×5): qty 1

## 2016-07-16 MED ORDER — VERAPAMIL HCL ER 120 MG PO TBCR
120.0000 mg | EXTENDED_RELEASE_TABLET | Freq: Every day | ORAL | Status: DC
  Administered 2016-07-17 – 2016-07-18 (×2): 120 mg via ORAL
  Filled 2016-07-16 (×3): qty 1

## 2016-07-16 MED ORDER — SODIUM CHLORIDE 0.9 % IV SOLN
1000.0000 mg | Freq: Once | INTRAVENOUS | Status: AC
  Administered 2016-07-16: 1000 mg via INTRAVENOUS
  Filled 2016-07-16: qty 10

## 2016-07-16 MED ORDER — GI COCKTAIL ~~LOC~~
30.0000 mL | Freq: Once | ORAL | Status: AC
  Administered 2016-07-16: 30 mL via ORAL
  Filled 2016-07-16: qty 30

## 2016-07-16 MED ORDER — SODIUM CHLORIDE 0.9 % IV BOLUS (SEPSIS)
1000.0000 mL | Freq: Once | INTRAVENOUS | Status: AC
  Administered 2016-07-16: 1000 mL via INTRAVENOUS

## 2016-07-16 MED ORDER — LACTATED RINGERS IV BOLUS (SEPSIS)
500.0000 mL | Freq: Once | INTRAVENOUS | Status: AC
  Administered 2016-07-16: 500 mL via INTRAVENOUS

## 2016-07-16 MED ORDER — LEVALBUTEROL HCL 0.63 MG/3ML IN NEBU: 0.6300 mg | INHALATION_SOLUTION | Freq: Four times a day (QID) | RESPIRATORY_TRACT | Status: DC | PRN

## 2016-07-16 MED ORDER — LORAZEPAM 2 MG/ML IJ SOLN
INTRAMUSCULAR | Status: AC
  Filled 2016-07-16: qty 1

## 2016-07-16 MED ORDER — ACETAMINOPHEN 650 MG RE SUPP: 650.0000 mg | Freq: Four times a day (QID) | RECTAL | Status: DC | PRN

## 2016-07-16 MED ORDER — QUETIAPINE FUMARATE 25 MG PO TABS
50.0000 mg | ORAL_TABLET | Freq: Two times a day (BID) | ORAL | Status: DC
  Administered 2016-07-17 – 2016-07-19 (×4): 50 mg via ORAL
  Filled 2016-07-16 (×4): qty 2

## 2016-07-16 NOTE — Consult Note (Signed)
Neurology Consultation Reason for Consult: Altered mental status and a seizure Referring Physician: Ellender Hose, C  CC: Altered mental status  History is obtained from: Medical staff  HPI: Dennis Combs is a 76 y.o. male who apparently has been confused for the past 24 hours. He has had worsening dementia and are most recent notes are from July 2016 stating that he was no longer able to be cared for at home.   In the ER, he appears to have some sort of infectious process going on with an elevated lactate, leukocytosis. He was brought here because of worsening mental status and garbled speech. While in the ER, he had a generalized seizure. He is currently being loaded with Keppra.    ROS:  Unable to obtain due to altered mental status.   Past Medical History:  Diagnosis Date  . AAA (abdominal aortic aneurysm) ([place]) 2007  . Anal fissure   . Anxiety   . CAD (coronary artery disease)    last catheterization 2004, medical management.  . Cardiac arrhythmia   . COPD (chronic obstructive pulmonary disease) ([place])   . Dementia   . Diverticulosis of colon (without mention of hemorrhage)   . Enlargement of lymph nodes   . Gastroparesis   . GERD (gastroesophageal reflux disease)   . 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 Hemorrhoids   . Hiatal hernia   . History of pneumonia   . Hyperglycemia   . Hyperlipidemia   . Perirectal abscess 02/06/12  . Personal history of colonic polyps    hyperplastic polyps (2004) & Tubular adenoma(2000)  . PUD (peptic ulcer disease)   . SVT (supraventricular tachycardia) ([place])   . Tobacco abuse      Family History  Problem Relation Age of Onset  . Diabetes Mother   . Colon polyps Mother   . Ulcerative colitis Mother     ??  . Prostate cancer Father      Social History:  reports that he has been smoking Cigarettes.  He has a 25.00 pack-year smoking history. He has never used smokeless tobacco. He reports that he does not drink alcohol or use  drugs.   Exam: Current vital signs: BP 106/65   Pulse 98   Resp (!) 21   Wt 72.6 kg (160 lb)   SpO2 98%   BMI 22.96 kg/m  Vital signs in last 24 hours: Pulse Rate:  [88-118] 98 (04/25 1630) Resp:  [17-21] 21 (04/25 1630) BP: (106-125)/(65-84) 106/65 (04/25 1630) SpO2:  [97 %-99 %] 98 % (04/25 1630) Weight:  [72.6 kg (160 lb)] 72.6 kg (160 lb) (04/25 1338)   Physical Exam  Constitutional: Appears well-developed and well-nourished.  Psych: Affect appropriate to situation Eyes: No scleral injection HENT: No OP obstrucion Head: Normocephalic.  Cardiovascular: Normal rate and regular rhythm.  Respiratory: Effort normal and breath sounds normal to anterior ascultation GI: Soft.  No distension. There is no tenderness.  Skin: WDI  Neuro: Mental Status: Patient is awake, alert, oriented to person only, he is able to follow commands but clearly confused Cranial Nerves: II: Visual Fields are full. Pupils are equal, round, and reactive to light.   III,IV, VI: EOMI without ptosis or diploplia.  V: Facial sensation is symmetric to temperature VII: Facial movement is symmetric.  VIII: hearing is intact to voice X: Uvula elevates symmetrically XI: Shoulder shrug is symmetric. XII: tongue is midline without atrophy or fasciculations.  Motor: He does not participate well with formal strength testing but appears  to move both sides equally Sensory: He endorses symmetric sensation bilaterally  Cerebellar: No clear ataxia, but the patient does not cooperate very well.      I have reviewed labs in epic and the results pertinent to this consultation are: UA-negative  I have reviewed the images obtained: CT head-no acute finding  Impression: 76 year old male with dementia and altered mental status. Any infectious process could be contributing to her worsening mental status in this demented patient, however with new onset seizures and no obvious source of infection I do think that  lumbar puncture would be indicated to rule out CNS infection.  Recommendations: 1) LP for cells, glucose, protein, culture, HSV 2) Keppra 1 g 1 then 500 twice a day 3) if there is a pleocytosis, then empiric coverage for meningeal/encephalitic process is 4) EEG  5) neurology will continue to follow   Roland Rack, MD [facility] [phone]  If 7pm- 7am, please page neurology on call as listed in [place].

## 2016-07-16 NOTE — Progress Notes (Signed)
Pharmacy Antibiotic Note Dennis Combs is a 76 y.o. male admitted on 07/16/2016 with sepsis.  Pharmacy has been consulted for Zosyn and vancomycin dosing.  Plan: 1. Vancomycin 1250 IV every 24 hours.  Goal trough 10-15 mcg/mL. 2. Zosyn 3.375g IV q8h (4 hour infusion).  3. Limit abx exposure as feasible.   Weight: 160 lb (72.6 kg)  No data recorded.   Recent Labs Lab 07/16/16 1357 07/16/16 1407  WBC 13.6*  --   CREATININE 1.08  --   LATICACIDVEN  --  2.54*    CrCl cannot be calculated (Unknown ideal weight.).    Allergies  Allergen Reactions  . Ivp Dye [Iodinated Diagnostic Agents] Hives, Itching and Other (See Comments)    Mouth itching  . Metronidazole Diarrhea and Nausea And Vomiting  . Codeine Hives, Itching and Rash  . Percocet [Oxycodone-Acetaminophen] Itching    Antimicrobials this admission: 4/25 Ceftriaxone x 1  4/25 Zosyn >>   4/25 vancomycin >>   Microbiology results: px  Thank you for allowing pharmacy to be a part of this patient's care.  Vincenza Hews, PharmD, BCPS 07/16/2016, 3:55 PM

## 2016-07-16 NOTE — ED Notes (Addendum)
Attempted urine sample collection. Pt will call when he can provide one.

## 2016-07-16 NOTE — ED Notes (Signed)
Attempted to call report x 1  

## 2016-07-16 NOTE — H&P (Signed)
[facility] History and Physical  Dennis Combs XBD:[id number] DOB: [birth year] DOA: 07/16/2016  Referring physician:   PCP: No PCP Per Patient   Chief Complaint: Altered mental status  HPI:   76 yo M with PMHx of HTN, HLD, CAD, DM, dementia here with altered mental status. Patient was brought in via EMS for slurred speech and confusion that started unknown yesterday. He was found to be confused, defecated on himself, unable to speak for 30 minutes. When in the ER patient was found to have a generalized seizure and was loaded with Keppra. Patient is unable to provide any meaningful history. Patient was evaluated by neurology, they  recommended LP. According to ER physicians of CSF fluid appeared to be clear. CSF fluid analysis pending. Patient Keppra 1 g 1 then 500 twice a day. Patient Also Was Found to Have Leukocytosis and Was Given Empiric Antibiotics, ED course  BP 125/74   Pulse 88   Resp 20   Wt 160 lb (72.6 kg)   SpO2 98%   BMI 22.96 kg/m  Patient found to be confused, speech and did return to baseline after a postictal state.However, given continued work-up without apparent infection source and persistent mild tachycardia, decision made to treat empirically with IVF, IV ABX and admit for possible sepsis /neuro work up.     ROS:  Unable to obtain due to altered mental status.   Past Medical History:  Diagnosis Date  . AAA (abdominal aortic aneurysm) ([place]) 2007  . Anal fissure   . Anxiety   . CAD (coronary artery disease)    last catheterization 2004, medical management.  . Cardiac arrhythmia   . COPD (chronic obstructive pulmonary disease) ([place])   . Dementia   . Diverticulosis of colon (without mention of hemorrhage)   . Enlargement of lymph nodes   . Gastroparesis   . GERD (gastroesophageal reflux disease)   . 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 Hemorrhoids   . Hiatal hernia   . History of pneumonia   . Hyperglycemia   . Hyperlipidemia   . Perirectal  abscess 02/06/12  . Personal history of colonic polyps    hyperplastic polyps (2004) & Tubular adenoma(2000)  . PUD (peptic ulcer disease)   . SVT (supraventricular tachycardia) ([place])   . Tobacco abuse      Past Surgical History:  Procedure Laterality Date  . ABDOMINAL SURGERY  1999   Vagotomy and pyloroplasty due to bleeding ulcer  . APPENDECTOMY    . TONSILLECTOMY        Social History:  reports that he has been smoking Cigarettes.  He has a 25.00 pack-year smoking history. He has never used smokeless tobacco. He reports that he does not drink alcohol or use drugs.    Allergies  Allergen Reactions  . Ivp Dye [Iodinated Diagnostic Agents] Hives, Itching and Other (See Comments)    Mouth itching  . Metronidazole Diarrhea and Nausea And Vomiting  . Codeine Hives, Itching and Rash  . Percocet [Oxycodone-Acetaminophen] Itching    Family History  Problem Relation Age of Onset  . Diabetes Mother   . Colon polyps Mother   . Ulcerative colitis Mother     ??  . Prostate cancer Father          Prior to Admission medications   Medication Sig Start Date End Date Taking? Authorizing Provider  aspirin 81 MG tablet Take 81 mg by mouth daily.    Historical Provider, MD  CHOLECALCIFEROL PO Take 1 tablet  by mouth See admin instructions. Takes occasionally    Historical Provider, MD  cyanocobalamin (,VITAMIN B-12,) 1000 MCG/ML injection Inject 1 mL (1,000 mcg total) into the muscle every 30 (thirty) days. Inject once, every 4 weeks. Patient not taking: Reported on 04/08/2014 09/02/12   Colon Branch, MD  diazepam (VALIUM) 2 MG tablet Take 0.5 tablets (1 mg total) by mouth 3 (three) times daily. 11/19/12   Colon Branch, MD  fluocinonide cream (LIDEX) 5.73 % Apply 1 application topically as needed (for facial rash).    Historical Provider, MD  folic acid (FOLVITE) 1 MG tablet Take 1 tablet (1 mg total) by mouth daily. 03/29/12   Colon Branch, MD  Naproxen Sod-Diphenhydramine (ALEVE PM) 220-25 MG  TABS Take 2 tablets by mouth at bedtime.    Historical Provider, MD  QUEtiapine (SEROQUEL) 25 MG tablet Take 50 mg by mouth 2 (two) times daily.    Historical Provider, MD  verapamil (VERELAN PM) 120 MG 24 hr capsule Take 120 mg by mouth at bedtime.    Historical Provider, MD  verapamil (VERELAN PM) 180 MG 24 hr capsule Take 1 capsule (180 mg total) by mouth daily. 11/18/12   Colon Branch, MD     Physical Exam: Vitals:   07/16/16 1500 07/16/16 1530 07/16/16 1600 07/16/16 1630  BP: 125/74 110/76 125/73 106/65  Pulse: 88 89 (!) 118 98  Resp: 20 17 (!) 21 (!) 21  SpO2: 98% 98% 98% 98%  Weight:            Vitals:   07/16/16 1500 07/16/16 1530 07/16/16 1600 07/16/16 1630  BP: 125/74 110/76 125/73 106/65  Pulse: 88 89 (!) 118 98  Resp: 20 17 (!) 21 (!) 21  SpO2: 98% 98% 98% 98%  Weight:       Constitutional: confused  Eyes: PERRL, lids and conjunctivae normal ENMT: Mucous membranes are moist. Posterior pharynx clear of any exudate or lesions.Normal dentition.  Neck: normal, supple, no masses, no thyromegaly Respiratory: clear to auscultation bilaterally, no wheezing, no crackles. Normal respiratory effort. No accessory muscle use.  Cardiovascular: Regular rhythm and normal heart sounds.  Tachycardia present.  Exam reveals no friction rub.   No murmur heard. Pulmonary/Chest: Effort normal and breath sounds normal. No respiratory distress. He has no wheezes. He has no rales.  Abdominal: He exhibits no distension. There is tenderness in the suprapubic area.  Musculoskeletal: He exhibits no edema.  Neurological: He exhibits normal muscle tone.  Disoriented, moaning and mumbling. Intermittently follows simple commands but not oriented to person, place, or time. Face is symmetric. MAE with at least antigravity strength. Reflxes 2+ RUE and RLE, 3+ LUE and LLE. Withdraws to noxious stimuli b/l UE and LE. Psychiatric: Normal judgment and insight. Alert and oriented x 3. Normal mood.     Labs  on Admission: I have personally reviewed following labs and imaging studies  CBC:  Recent Labs Lab 07/16/16 1357  WBC 13.6*  NEUTROABS 12.0*  HGB 14.4  HCT 44.8  MCV 86.7  PLT 220    Basic Metabolic Panel:  Recent Labs Lab 07/16/16 1357  NA 139  K 3.9  CL 103  CO2 23  GLUCOSE 148*  BUN 15  CREATININE 1.08  CALCIUM 9.5    GFR: CrCl cannot be calculated (Unknown ideal weight.).  Liver Function Tests:  Recent Labs Lab 07/16/16 1357  AST 26  ALT 14*  ALKPHOS 84  BILITOT 1.3*  PROT 7.2  ALBUMIN 3.9  No results for input(s): LIPASE, AMYLASE in the last 168 hours. No results for input(s): AMMONIA in the last 168 hours.  Coagulation Profile: No results for input(s): INR, PROTIME in the last 168 hours. No results for input(s): DDIMER in the last 72 hours.  Cardiac Enzymes: No results for input(s): CKTOTAL, CKMB, CKMBINDEX, TROPONINI in the last 168 hours.  BNP (last 3 results) No results for input(s): PROBNP in the last 8760 hours.  HbA1C: No results for input(s): HGBA1C in the last 72 hours. Lab Results  Component Value Date   HGBA1C 5.8 06/30/2012   HGBA1C 5.7 04/09/2011   HGBA1C 5.9 04/13/2009     CBG: No results for input(s): GLUCAP in the last 168 hours.  Lipid Profile: No results for input(s): CHOL, HDL, LDLCALC, TRIG, CHOLHDL, LDLDIRECT in the last 72 hours.  Thyroid Function Tests: No results for input(s): TSH, T4TOTAL, FREET4, T3FREE, THYROIDAB in the last 72 hours.  Anemia Panel: No results for input(s): VITAMINB12, FOLATE, FERRITIN, TIBC, IRON, RETICCTPCT in the last 72 hours.  Urine analysis:    Component Value Date/Time   COLORURINE YELLOW 07/16/2016 1618   APPEARANCEUR CLEAR 07/16/2016 1618   LABSPEC 1.025 07/16/2016 1618   PHURINE 5.0 07/16/2016 1618   GLUCOSEU NEGATIVE 07/16/2016 1618   HGBUR SMALL (A) 07/16/2016 1618   BILIRUBINUR NEGATIVE 07/16/2016 1618   BILIRUBINUR Neg 09/22/2012 1206   KETONESUR 20 (A) 07/16/2016  1618   PROTEINUR 30 (A) 07/16/2016 1618   UROBILINOGEN 0.2 10/15/2014 1319   NITRITE NEGATIVE 07/16/2016 1618   LEUKOCYTESUR NEGATIVE 07/16/2016 1618    Sepsis Labs: @LABRCNTIP (procalcitonin:4,lacticidven:4) )No results found for this or any previous visit (from the past 240 hour(s)).       Radiological Exams on Admission: Ct Head Wo Contrast  Result Date: 07/16/2016 CLINICAL DATA:  Expressive aphasia beginning yesterday. History of dementia. EXAM: CT HEAD WITHOUT CONTRAST TECHNIQUE: Contiguous axial images were obtained from the base of the skull through the vertex without intravenous contrast. COMPARISON:  10/15/2014 FINDINGS: Brain: No evidence of acute infarction, hemorrhage, hydrocephalus, extra-axial collection or mass lesion/mass effect. Moderate atrophy and mild periventricular chronic microvascular ischemic change. No convincing change since prior. Vascular: Atherosclerotic calcification.  No hyperdense vessel. Skull: Negative Sinuses/Orbits: Polyps in the bilateral upper and anterior nasal cavity, larger on the left and stable from 2016 at least. No acute finding. IMPRESSION: 1. No acute finding. 2. Moderate atrophy. Electronically Signed   By: Monte Fantasia M.D.   On: 07/16/2016 14:48   Dg Chest Port 1 View  Result Date: 07/16/2016 CLINICAL DATA:  Confusion.  Weakness . EXAM: PORTABLE CHEST 1 VIEW COMPARISON:  10/15/2014 . FINDINGS: Mediastinum and hilar structures are normal. Heart size normal. Calcified pulmonary nodular densities are again noted most likely secondary to prior granulomas disease. No focal infiltrate. Linear density noted over the right upper lung most likely skin fold. Lung markings are noted distal to this linear density. Similar finding noted on prior exam. Surgical clips noted over the gastroesophageal junction . No acute bony abnormality. IMPRESSION: No acute cardiopulmonary disease. Electronically Signed   By: Marcello Moores  Register   On: 07/16/2016 14:58   Ct  Head Wo Contrast  Result Date: 07/16/2016 CLINICAL DATA:  Expressive aphasia beginning yesterday. History of dementia. EXAM: CT HEAD WITHOUT CONTRAST TECHNIQUE: Contiguous axial images were obtained from the base of the skull through the vertex without intravenous contrast. COMPARISON:  10/15/2014 FINDINGS: Brain: No evidence of acute infarction, hemorrhage, hydrocephalus, extra-axial collection or mass lesion/mass effect. Moderate  atrophy and mild periventricular chronic microvascular ischemic change. No convincing change since prior. Vascular: Atherosclerotic calcification.  No hyperdense vessel. Skull: Negative Sinuses/Orbits: Polyps in the bilateral upper and anterior nasal cavity, larger on the left and stable from 2016 at least. No acute finding. IMPRESSION: 1. No acute finding. 2. Moderate atrophy. Electronically Signed   By: Monte Fantasia M.D.   On: 07/16/2016 14:48   Dg Chest Port 1 View  Result Date: 07/16/2016 CLINICAL DATA:  Confusion.  Weakness . EXAM: PORTABLE CHEST 1 VIEW COMPARISON:  10/15/2014 . FINDINGS: Mediastinum and hilar structures are normal. Heart size normal. Calcified pulmonary nodular densities are again noted most likely secondary to prior granulomas disease. No focal infiltrate. Linear density noted over the right upper lung most likely skin fold. Lung markings are noted distal to this linear density. Similar finding noted on prior exam. Surgical clips noted over the gastroesophageal junction . No acute bony abnormality. IMPRESSION: No acute cardiopulmonary disease. Electronically Signed   By: Marcello Moores  Register   On: 07/16/2016 14:58      EKG: Independently reviewed.   Assessment/Plan Active Problems:   Altered mental status versus new onset seizure Could be precipitated by underlying infectious process CSF analysis pending, if the patient has elevated white count will continue empiric antibiotics We will continue empiric antibiotics for possible sepsis CT head  negative for hydrocephalus, acute infarction Could consider MRI and the patient is able to tolerate EDP unable to measure opening pressure, [patient has a history of frontotemporal dementia] Follow HSV PCR on CSF Check vitamin B-12, TSH Minimize benzodiazepines  New onset seizure Patient received loading dose of Keppra, continue Keppra 500 mg twice a day EEG Appreciate neurology input Seizure precautions   Leukocytosis White blood cell count Continue to follow results of blood culture 2, urine culture, CSF culture  COPD-stable  Hypertension-continue verapamil     DVT prophylaxis:  Lovenox      Code Status Orders        Start     Ordered   07/16/16 1744  Full code  Continuous     07/16/16 1745    Code Status History    Date Active Date Inactive Code Status Order ID Comments User Context   10/15/2014  3:25 PM 10/15/2014  9:25 PM Full Code [id number]  Ezequiel Essex, MD ED       consults called:Neurology  Family Communication: Admission, patients condition and plan of care including tests being ordered have been discussed with the patient  who indicates understanding and agree with the plan and Code Status  Admission status: Observation  Disposition plan: Further plan will depend as patient's clinical course evolves and further radiologic and laboratory data become available. Likely home when stable      Reyne Dumas MD [facility] Pager [phone]  If 7PM-7AM, please contact night-coverage [email or web address] Password [facility]  07/16/2016, 5:49 PM

## 2016-07-16 NOTE — ED Triage Notes (Signed)
Pt in from home via [facility] with slurred speech and confusion since 1200 yesterday. Hx of dementia, alert to self only on assessment. Per EMS, pt's wife noticed he had defecated on himself yesterday at noon, was unable to speak x 30 min, and started having slurred speaking at 1230. CBG 148, VSS

## 2016-07-16 NOTE — ED Provider Notes (Signed)
[facility] DEPT Provider Note   CSN: [id number] Arrival date & time: 07/16/16  1330     History   Chief Complaint Chief Complaint  Patient presents with  . Transient Ischemic Attack  . Aphasia    HPI Dennis Combs is a 76 y.o. male.  HPI   76 yo M with PMHx of HTN, HLD, CAD, DM, dementia here with altered mental status. History limited 2/2 dementia, AMS but per EMS, pt has had difficulty speaking and increased confusion x 24 hours. He was intermittently tachycardic enr oute with garbled, non-sensical speech. He will follow simple commands intermittently. Per wife report to EMS, last known normal was >24 hours ago. No known fevers.  Level 5 caveat invoked as remainder of history, ROS, and physical exam limited due to patient's altered mental status.   Past Medical History:  Diagnosis Date  . AAA (abdominal aortic aneurysm) ([place]) 2007  . Anal fissure   . Anxiety   . CAD (coronary artery disease)    last catheterization 2004, medical management.  . Cardiac arrhythmia   . COPD (chronic obstructive pulmonary disease) ([place])   . Dementia   . Diverticulosis of colon (without mention of hemorrhage)   . Enlargement of lymph nodes   . Gastroparesis   . GERD (gastroesophageal reflux disease)   . 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 Hemorrhoids   . Hiatal hernia   . History of pneumonia   . Hyperglycemia   . Hyperlipidemia   . Perirectal abscess 02/06/12  . Personal history of colonic polyps    hyperplastic polyps (2004) & Tubular adenoma(2000)  . PUD (peptic ulcer disease)   . SVT (supraventricular tachycardia) ([place])   . Tobacco abuse     Patient Active Problem List   Diagnosis Date Noted  . Altered mental status 07/16/2016  . Urinary incontinence 09/22/2012  . Tremor 06/30/2012  . Vitamin B 12 deficiency 03/25/2012  . Gait disturbance 04/10/2011  . Tobacco abuse   . Testicular pain 04/09/2011  . MRSA cellulitis 02/05/2011  . CAD (coronary artery disease)  01/07/2011  . Lymphadenopathy 12/16/2010  . Chills, Weight loss 12/16/2010  . HYPERLIPIDEMIA 02/25/2007  . Depression 02/25/2007  . AAA 02/25/2007  . GERD 02/25/2007  . OSTEOPENIA 02/25/2007  . IMPAIRED FASTING GLUCOSE 02/25/2007  . SUPRAVENTRICULAR TACHYCARDIA, HX OF 02/25/2007  . PUD, HX OF 02/25/2007  . DIVERTICULITIS, HX OF 02/25/2007    Past Surgical History:  Procedure Laterality Date  . ABDOMINAL SURGERY  1999   Vagotomy and pyloroplasty due to bleeding ulcer  . APPENDECTOMY    . TONSILLECTOMY         Home Medications    Prior to Admission medications   Medication Sig Start Date End Date Taking? Authorizing Provider  aspirin 81 MG tablet Take 81 mg by mouth daily.   Yes Historical Provider, MD  atorvastatin (LIPITOR) 20 MG tablet Take 20 mg by mouth daily. 07/08/16  Yes Historical Provider, MD  CHOLECALCIFEROL PO Take 1 tablet by mouth See admin instructions. Takes occasionally   Yes Historical Provider, MD  QUEtiapine (SEROQUEL) 25 MG tablet Take 50 mg by mouth 2 (two) times daily.   Yes Historical Provider, MD  verapamil (VERELAN PM) 120 MG 24 hr capsule Take 120 mg by mouth at bedtime.   Yes Historical Provider, MD  cyanocobalamin (,VITAMIN B-12,) 1000 MCG/ML injection Inject 1 mL (1,000 mcg total) into the muscle every 30 (thirty) days. Inject once, every 4 weeks. Patient not taking: Reported on  04/08/2014 09/02/12   Colon Branch, MD  fluocinonide cream (LIDEX) 4.33 % Apply 1 application topically as needed (for facial rash).    Historical Provider, MD  Naproxen Sod-Diphenhydramine (ALEVE PM) 220-25 MG TABS Take 2 tablets by mouth at bedtime.    Historical Provider, MD    Family History Family History  Problem Relation Age of Onset  . Diabetes Mother   . Colon polyps Mother   . Ulcerative colitis Mother     ??  . Prostate cancer Father     Social History Social History  Substance Use Topics  . Smoking status: Current Every Day Smoker    Packs/day: 0.50     Years: 50.00    Types: Cigarettes  . Smokeless tobacco: Never Used     Comment: down from several ppd  . Alcohol use No     Comment: previous alcohol abuse, quit 2 years ago     Allergies   Ivp dye [iodinated diagnostic agents]; Metronidazole; Codeine; and Percocet [oxycodone-acetaminophen]   Review of Systems Review of Systems  Unable to perform ROS: Dementia     Physical Exam Updated Vital Signs BP 107/67   Pulse 74   Resp (!) 22   Wt 160 lb (72.6 kg)   SpO2 98%   BMI 22.96 kg/m   Physical Exam  Constitutional: He appears well-developed and well-nourished. He appears distressed.  HENT:  Head: Normocephalic and atraumatic.  Moderately dry MM  Eyes: Conjunctivae are normal.  Neck: Neck supple.  Cardiovascular: Regular rhythm and normal heart sounds.  Tachycardia present.  Exam reveals no friction rub.   No murmur heard. Pulmonary/Chest: Effort normal and breath sounds normal. No respiratory distress. He has no wheezes. He has no rales.  Abdominal: He exhibits no distension. There is tenderness in the suprapubic area.  Musculoskeletal: He exhibits no edema.  Neurological: He exhibits normal muscle tone.  Disoriented, moaning and mumbling. Intermittently follows simple commands but not oriented to person, place, or time. Face is symmetric. MAE with at least antigravity strength. Reflxes 2+ RUE and RLE, 3+ LUE and LLE. Withdraws to noxious stimuli b/l UE and LE.  Skin: Skin is warm. Capillary refill takes less than 2 seconds.  Psychiatric: He has a normal mood and affect.  Nursing note and vitals reviewed.    ED Treatments / Results  Labs (all labs ordered are listed, but only abnormal results are displayed) Labs Reviewed  CBC WITH DIFFERENTIAL/PLATELET - Abnormal; Notable for the following:       Result Value   WBC 13.6 (*)    Neutro Abs 12.0 (*)    All other components within normal limits  COMPREHENSIVE METABOLIC PANEL - Abnormal; Notable for the following:      Glucose, Bld 148 (*)    ALT 14 (*)    Total Bilirubin 1.3 (*)    All other components within normal limits  URINALYSIS, ROUTINE W REFLEX MICROSCOPIC - Abnormal; Notable for the following:    Hgb urine dipstick SMALL (*)    Ketones, ur 20 (*)    Protein, ur 30 (*)    All other components within normal limits  PROTEIN, CSF - Abnormal; Notable for the following:    Total  Protein, CSF 84 (*)    All other components within normal limits  CBC - Abnormal; Notable for the following:    WBC 16.1 (*)    All other components within normal limits  COMPREHENSIVE METABOLIC PANEL - Abnormal; Notable for the following:  Glucose, Bld 199 (*)    Calcium 8.4 (*)    Total Protein 6.2 (*)    Albumin 3.3 (*)    ALT 15 (*)    Total Bilirubin 1.3 (*)    All other components within normal limits  I-STAT CG4 LACTIC ACID, ED - Abnormal; Notable for the following:    Lactic Acid, Venous 2.54 (*)    All other components within normal limits  I-STAT CG4 LACTIC ACID, ED - Abnormal; Notable for the following:    Lactic Acid, Venous 2.37 (*)    All other components within normal limits  CSF CULTURE  CULTURE, BLOOD (ROUTINE X 2)  CULTURE, BLOOD (ROUTINE X 2)  URINE CULTURE  GLUCOSE, CSF  MAGNESIUM  CSF CELL COUNT WITH DIFFERENTIAL  CSF CELL COUNT WITH DIFFERENTIAL  HERPES SIMPLEX VIRUS(HSV) DNA BY PCR  HERPES SIMPLEX VIRUS(HSV) DNA BY PCR  CBC  COMPREHENSIVE METABOLIC PANEL  VITAMIN O35  TSH  I-STAT TROPOININ, ED    EKG  EKG Interpretation  Date/Time:  Wednesday July 16 2016 13:30:37 EDT Ventricular Rate:  104 PR Interval:    QRS Duration: 96 QT Interval:  361 QTC Calculation: 475 R Axis:   92 Text Interpretation:  Sinus tachycardia Ventricular premature complex Right axis deviation Since last EKG, rate has increased Confirmed by Ellender Hose MD, Lysbeth Galas [phone]) on 07/16/2016 1:43:22 PM Also confirmed by Ellender Hose MD, Jaquay Morneault [phone]), editor Drema Pry [phone])  on 07/16/2016 1:48:04 PM        Radiology Ct Head Wo Contrast  Result Date: 07/16/2016 CLINICAL DATA:  Expressive aphasia beginning yesterday. History of dementia. EXAM: CT HEAD WITHOUT CONTRAST TECHNIQUE: Contiguous axial images were obtained from the base of the skull through the vertex without intravenous contrast. COMPARISON:  10/15/2014 FINDINGS: Brain: No evidence of acute infarction, hemorrhage, hydrocephalus, extra-axial collection or mass lesion/mass effect. Moderate atrophy and mild periventricular chronic microvascular ischemic change. No convincing change since prior. Vascular: Atherosclerotic calcification.  No hyperdense vessel. Skull: Negative Sinuses/Orbits: Polyps in the bilateral upper and anterior nasal cavity, larger on the left and stable from 2016 at least. No acute finding. IMPRESSION: 1. No acute finding. 2. Moderate atrophy. Electronically Signed   By: Monte Fantasia M.D.   On: 07/16/2016 14:48   Dg Chest Port 1 View  Result Date: 07/16/2016 CLINICAL DATA:  Confusion.  Weakness . EXAM: PORTABLE CHEST 1 VIEW COMPARISON:  10/15/2014 . FINDINGS: Mediastinum and hilar structures are normal. Heart size normal. Calcified pulmonary nodular densities are again noted most likely secondary to prior granulomas disease. No focal infiltrate. Linear density noted over the right upper lung most likely skin fold. Lung markings are noted distal to this linear density. Similar finding noted on prior exam. Surgical clips noted over the gastroesophageal junction . No acute bony abnormality. IMPRESSION: No acute cardiopulmonary disease. Electronically Signed   By: Marcello Moores  Register   On: 07/16/2016 14:58    Procedures .Critical Care Performed by: Duffy Bruce Authorized by: Duffy Bruce   Critical care provider statement:    Critical care time (minutes):  35   Critical care was necessary to treat or prevent imminent or life-threatening deterioration of the following conditions:  CNS failure or compromise,  dehydration, sepsis and respiratory failure   Critical care was time spent personally by me on the following activities:  Development of treatment plan with patient or surrogate, discussions with consultants, discussions with primary provider, evaluation of patient's response to treatment, examination of patient, obtaining history from patient or surrogate, ordering  and performing treatments and interventions, ordering and review of laboratory studies, re-evaluation of patient's condition, pulse oximetry, ordering and review of radiographic studies and review of old charts   I assumed direction of critical care for this patient from another provider in my specialty: no   .Lumbar Puncture Date/Time: 07/16/2016 7:20 PM Performed by: Duffy Bruce Authorized by: Duffy Bruce   Consent:    Consent obtained:  Emergent situation Universal protocol:    Immediately prior to procedure a time out was called: yes     Site/side marked: yes     Patient identity confirmed:  Arm band Pre-procedure details:    Procedure purpose:  Diagnostic   Preparation: Patient was prepped and draped in usual sterile fashion   Anesthesia (see MAR for exact dosages):    Anesthesia method:  Local infiltration   Local anesthetic:  Lidocaine 1% w/o epi Procedure details:    Lumbar space:  L4-L5 interspace   Patient position:  L lateral decubitus   Needle gauge:  20   Needle type:  Diamond point   Needle length (in):  3.5   Ultrasound guidance: no     Number of attempts:  2   Fluid appearance:  Blood-tinged then clearing   Tubes of fluid:  4   Total volume (ml):  6 Post-procedure:    Puncture site:  Adhesive bandage applied   Patient tolerance of procedure:  Tolerated well, no immediate complications   (including critical care time)  Medications Ordered in ED Medications  piperacillin-tazobactam (ZOSYN) IVPB 3.375 g (not administered)  vancomycin (VANCOCIN) 1,250 mg in sodium chloride 0.9 % 250 mL IVPB (not  administered)  lidocaine (cardiac) 100 mg/20ml (XYLOCAINE) 20 MG/ML injection 2% (not administered)  enoxaparin (LOVENOX) injection 40 mg (not administered)  sodium chloride flush (NS) 0.9 % injection 3 mL (not administered)  sodium chloride flush (NS) 0.9 % injection 3 mL (not administered)  0.9 %  sodium chloride infusion (not administered)  acetaminophen (TYLENOL) tablet 650 mg (not administered)    Or  acetaminophen (TYLENOL) suppository 650 mg (not administered)  ondansetron (ZOFRAN) tablet 4 mg (not administered)    Or  ondansetron (ZOFRAN) injection 4 mg (not administered)  levalbuterol (XOPENEX) nebulizer solution 0.63 mg (not administered)  0.9 %  sodium chloride infusion (not administered)  levETIRAcetam (KEPPRA) tablet 500 mg (not administered)  sodium chloride 0.9 % bolus 1,000 mL (0 mLs Intravenous Stopped 07/16/16 1549)  sodium chloride 0.9 % bolus 1,000 mL (0 mLs Intravenous Stopped 07/16/16 1709)  lactated ringers bolus 500 mL (0 mLs Intravenous Stopped 07/16/16 1710)  levETIRAcetam (KEPPRA) 1,000 mg in sodium chloride 0.9 % 100 mL IVPB (0 mg Intravenous Stopped 07/16/16 1616)  LORazepam (ATIVAN) injection 0.5 mg (0.5 mg Intravenous Given 07/16/16 1543)  piperacillin-tazobactam (ZOSYN) IVPB 3.375 g (0 g Intravenous Stopped 07/16/16 1629)  vancomycin (VANCOCIN) IVPB 1000 mg/200 mL premix (0 mg Intravenous Stopped 07/16/16 1710)  lidocaine (PF) (XYLOCAINE) 1 % injection 30 mL (30 mLs Intradermal Given 07/16/16 1655)     Initial Impression / Assessment and Plan / ED Course  I have reviewed the triage vital signs and the nursing notes.  Pertinent labs & imaging results that were available during my care of the patient were reviewed by me and considered in my medical decision making (see chart for details).    76 yo M with PMHx of HTN, HLD, CAD, DM, dementia here with altered mental status. On arrival, pt with slurred, dysarthric speech then noted to have GTC  seizure lasting <1  minute. Pt placed on NRB and seizure resolved spontaneously. IV Ativan and Keppra given, Neuro consulted. Lab work initially shows mild leukocytosis, lactic acidosis. Suspect this may be 2/2 primary seizures with intermittent post-ictal states in setting of FTD; lower suspicion for infection at this time. However, given continued work-up without apparent infection source and persistent mild tachycardia, decision made to treat empirically with IVF, IV ABX. Code sepsis initiated after primary etiology such as CVA, metabolic crisis thought less or equally likely.   Dr. Tobias Alexander of Neuro has evaluated and recommends LP. LP performed with emergent consent. Pt not on bloodt hinners, no mass effect on CT. ;Pt tolerated well and mental status is improving. CSF appeared clear; difficulty obtaining pressure 2/2 pt dementia, movement. 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 Admit to hospitlist with Neuro consulting. Attempted to call wife x 2; VM full and no one has picked up.   Final Clinical Impressions(s) / ED Diagnoses   Final diagnoses:  Acute encephalopathy  Generalized seizure ([place])  SIRS (systemic inflammatory response syndrome) [facility])    New Prescriptions Current Discharge Medication List       Duffy Bruce, MD 07/16/16 [place]

## 2016-07-16 NOTE — ED Notes (Signed)
Pt requesting to speak with RN. Pt made aware that RN is with another pt at this time but will be with him shortly.

## 2016-07-16 NOTE — ED Notes (Signed)
Pt taken to CT.

## 2016-07-17 ENCOUNTER — Observation Stay (HOSPITAL_COMMUNITY): Payer: Medicare PPO

## 2016-07-17 DIAGNOSIS — F0280 Dementia in other diseases classified elsewhere without behavioral disturbance: Secondary | ICD-10-CM | POA: Diagnosis present

## 2016-07-17 DIAGNOSIS — R000 Tachycardia, unspecified: Secondary | ICD-10-CM | POA: Diagnosis present

## 2016-07-17 DIAGNOSIS — G9340 Encephalopathy, unspecified: Secondary | ICD-10-CM

## 2016-07-17 DIAGNOSIS — Z79899 Other long term (current) drug therapy: Secondary | ICD-10-CM | POA: Diagnosis not present

## 2016-07-17 DIAGNOSIS — Z8701 Personal history of pneumonia (recurrent): Secondary | ICD-10-CM | POA: Diagnosis not present

## 2016-07-17 DIAGNOSIS — R569 Unspecified convulsions: Secondary | ICD-10-CM | POA: Diagnosis present

## 2016-07-17 DIAGNOSIS — I2510 Atherosclerotic heart disease of native coronary artery without angina pectoris: Secondary | ICD-10-CM

## 2016-07-17 DIAGNOSIS — R4182 Altered mental status, unspecified: Secondary | ICD-10-CM | POA: Diagnosis present

## 2016-07-17 DIAGNOSIS — I10 Essential (primary) hypertension: Secondary | ICD-10-CM | POA: Diagnosis present

## 2016-07-17 DIAGNOSIS — J449 Chronic obstructive pulmonary disease, unspecified: Secondary | ICD-10-CM | POA: Diagnosis present

## 2016-07-17 DIAGNOSIS — E1143 Type 2 diabetes mellitus with diabetic autonomic (poly)neuropathy: Secondary | ICD-10-CM | POA: Diagnosis present

## 2016-07-17 DIAGNOSIS — E785 Hyperlipidemia, unspecified: Secondary | ICD-10-CM | POA: Diagnosis present

## 2016-07-17 DIAGNOSIS — Z86010 Personal history of colonic polyps: Secondary | ICD-10-CM | POA: Diagnosis not present

## 2016-07-17 DIAGNOSIS — R6510 Systemic inflammatory response syndrome (SIRS) of non-infectious origin without acute organ dysfunction: Secondary | ICD-10-CM | POA: Diagnosis present

## 2016-07-17 DIAGNOSIS — Z7982 Long term (current) use of aspirin: Secondary | ICD-10-CM | POA: Diagnosis not present

## 2016-07-17 DIAGNOSIS — Z885 Allergy status to narcotic agent status: Secondary | ICD-10-CM | POA: Diagnosis not present

## 2016-07-17 DIAGNOSIS — G0490 Encephalitis and encephalomyelitis, unspecified: Secondary | ICD-10-CM | POA: Diagnosis present

## 2016-07-17 DIAGNOSIS — F17210 Nicotine dependence, cigarettes, uncomplicated: Secondary | ICD-10-CM | POA: Diagnosis present

## 2016-07-17 DIAGNOSIS — R4701 Aphasia: Secondary | ICD-10-CM | POA: Diagnosis present

## 2016-07-17 DIAGNOSIS — R400 Somnolence: Secondary | ICD-10-CM | POA: Diagnosis not present

## 2016-07-17 DIAGNOSIS — F0390 Unspecified dementia without behavioral disturbance: Secondary | ICD-10-CM | POA: Diagnosis present

## 2016-07-17 DIAGNOSIS — G3109 Other frontotemporal dementia: Secondary | ICD-10-CM | POA: Diagnosis present

## 2016-07-17 DIAGNOSIS — F419 Anxiety disorder, unspecified: Secondary | ICD-10-CM | POA: Diagnosis present

## 2016-07-17 DIAGNOSIS — Z91041 Radiographic dye allergy status: Secondary | ICD-10-CM | POA: Diagnosis not present

## 2016-07-17 DIAGNOSIS — K219 Gastro-esophageal reflux disease without esophagitis: Secondary | ICD-10-CM | POA: Diagnosis present

## 2016-07-17 DIAGNOSIS — Z8711 Personal history of peptic ulcer disease: Secondary | ICD-10-CM | POA: Diagnosis not present

## 2016-07-17 DIAGNOSIS — K210 Gastro-esophageal reflux disease with esophagitis: Secondary | ICD-10-CM | POA: Diagnosis not present

## 2016-07-17 LAB — COMPREHENSIVE METABOLIC PANEL
ALBUMIN: 3.4 g/dL — AB (ref 3.5–5.0)
ALT: 14 U/L — AB (ref 17–63)
AST: 22 U/L (ref 15–41)
Alkaline Phosphatase: 69 U/L (ref 38–126)
Anion gap: 9 (ref 5–15)
BUN: 11 mg/dL (ref 6–20)
CHLORIDE: 108 mmol/L (ref 101–111)
CO2: 24 mmol/L (ref 22–32)
CREATININE: 1.09 mg/dL (ref 0.61–1.24)
Calcium: 8.8 mg/dL — ABNORMAL LOW (ref 8.9–10.3)
GFR calc Af Amer: >60 mL/min (ref >60)
GLUCOSE: 113 mg/dL — AB (ref 65–99)
POTASSIUM: 3.6 mmol/L (ref 3.5–5.1)
SODIUM: 141 mmol/L (ref 135–145)
Total Bilirubin: 1.5 mg/dL — ABNORMAL HIGH (ref 0.3–1.2)
Total Protein: 6.4 g/dL — ABNORMAL LOW (ref 6.5–8.1)

## 2016-07-17 LAB — CBC
HCT: 40.4 % (ref 39.0–52.0)
Hemoglobin: 13.2 g/dL (ref 13.0–17.0)
MCH: 28.5 pg (ref 26.0–34.0)
MCHC: 32.7 g/dL (ref 30.0–36.0)
MCV: 87.3 fL (ref 78.0–100.0)
PLATELETS: 260 10*3/uL (ref 150–400)
RBC: 4.63 MIL/uL (ref 4.22–5.81)
RDW: 14.3 % (ref 11.5–15.5)
WBC: 9.6 10*3/uL (ref 4.0–10.5)

## 2016-07-17 LAB — URINE CULTURE: Culture: NO GROWTH

## 2016-07-17 LAB — BLOOD CULTURE ID PANEL (REFLEXED)
ACINETOBACTER BAUMANNII: NOT DETECTED
CANDIDA ALBICANS: NOT DETECTED
CANDIDA PARAPSILOSIS: NOT DETECTED
CANDIDA TROPICALIS: NOT DETECTED
Candida glabrata: NOT DETECTED
Candida krusei: NOT DETECTED
ENTEROBACTERIACEAE SPECIES: NOT DETECTED
ENTEROCOCCUS SPECIES: NOT DETECTED
Enterobacter cloacae complex: NOT DETECTED
Escherichia coli: NOT DETECTED
HAEMOPHILUS INFLUENZAE: NOT DETECTED
KLEBSIELLA OXYTOCA: NOT DETECTED
KLEBSIELLA PNEUMONIAE: NOT DETECTED
Listeria monocytogenes: NOT DETECTED
METHICILLIN RESISTANCE: NOT DETECTED
NEISSERIA MENINGITIDIS: NOT DETECTED
Proteus species: NOT DETECTED
Pseudomonas aeruginosa: NOT DETECTED
SERRATIA MARCESCENS: NOT DETECTED
STAPHYLOCOCCUS AUREUS BCID: NOT DETECTED
STREPTOCOCCUS PYOGENES: NOT DETECTED
STREPTOCOCCUS SPECIES: NOT DETECTED
Staphylococcus species: DETECTED — AB
Streptococcus agalactiae: NOT DETECTED
Streptococcus pneumoniae: NOT DETECTED

## 2016-07-17 LAB — GLUCOSE, CAPILLARY: Glucose-Capillary: 142 mg/dL — ABNORMAL HIGH (ref 65–99)

## 2016-07-17 MED ORDER — ACYCLOVIR SODIUM 50 MG/ML IV SOLN
10.0000 mg/kg | Freq: Three times a day (TID) | INTRAVENOUS | Status: DC
  Administered 2016-07-17 – 2016-07-19 (×7): 755 mg via INTRAVENOUS
  Filled 2016-07-17 (×8): qty 15.1

## 2016-07-17 MED ORDER — ORAL CARE MOUTH RINSE
15.0000 mL | Freq: Two times a day (BID) | OROMUCOSAL | Status: DC
  Administered 2016-07-17 – 2016-07-18 (×4): 15 mL via OROMUCOSAL

## 2016-07-17 MED ORDER — DEXTROSE 5 % IV SOLN
2.0000 g | Freq: Two times a day (BID) | INTRAVENOUS | Status: DC
  Administered 2016-07-17 – 2016-07-18 (×3): 2 g via INTRAVENOUS
  Filled 2016-07-17 (×4): qty 2

## 2016-07-17 MED ORDER — LORAZEPAM 2 MG/ML IJ SOLN
1.0000 mg | Freq: Once | INTRAMUSCULAR | Status: AC
  Administered 2016-07-17: 1 mg via INTRAVENOUS
  Filled 2016-07-17: qty 1

## 2016-07-17 MED ORDER — CHLORHEXIDINE GLUCONATE 0.12 % MT SOLN
15.0000 mL | Freq: Two times a day (BID) | OROMUCOSAL | Status: DC
  Administered 2016-07-18 – 2016-07-19 (×3): 15 mL via OROMUCOSAL
  Filled 2016-07-17 (×3): qty 15

## 2016-07-17 MED ORDER — CEFTRIAXONE SODIUM 2 G IJ SOLR
2.0000 g | Freq: Two times a day (BID) | INTRAMUSCULAR | Status: DC
  Filled 2016-07-17: qty 2

## 2016-07-17 MED ORDER — LORAZEPAM 1 MG PO TABS: 1.0000 mg | ORAL_TABLET | Freq: Once | ORAL | Status: DC

## 2016-07-17 MED ORDER — SODIUM CHLORIDE 0.9 % IV SOLN
1000.0000 mg | Freq: Two times a day (BID) | INTRAVENOUS | Status: DC
  Administered 2016-07-17: 1000 mg via INTRAVENOUS
  Filled 2016-07-17 (×2): qty 10

## 2016-07-17 MED ORDER — LORAZEPAM 1 MG PO TABS
1.0000 mg | ORAL_TABLET | Freq: Once | ORAL | Status: DC | PRN
  Filled 2016-07-17: qty 1

## 2016-07-17 MED ORDER — DEXTROSE 5 % IV SOLN
2.0000 g | Freq: Three times a day (TID) | INTRAVENOUS | Status: DC
  Administered 2016-07-17: 2 g via INTRAVENOUS
  Filled 2016-07-17 (×2): qty 2

## 2016-07-17 MED ORDER — LORAZEPAM 2 MG/ML IJ SOLN: 1.0000 mg | Freq: Once | INTRAMUSCULAR | Status: AC

## 2016-07-17 NOTE — Progress Notes (Signed)
Patient has not given iv piggy bag of piperacillin- tazobactam at 0700 because time to be adjusted for next 8 hrs therefore requested to pharmacy for the adjustment.

## 2016-07-17 NOTE — Progress Notes (Signed)
EEG Completed; Results Pending  

## 2016-07-17 NOTE — Progress Notes (Signed)
PHARMACY - PHYSICIAN COMMUNICATION CRITICAL VALUE ALERT - BLOOD CULTURE IDENTIFICATION (BCID)  Results for orders placed or performed during the hospital encounter of 07/16/16  Blood Culture ID Panel (Reflexed) (Collected: 07/16/2016  3:30 PM)  Result Value Ref Range   Enterococcus species NOT DETECTED NOT DETECTED   Listeria monocytogenes NOT DETECTED NOT DETECTED   Staphylococcus species DETECTED (A) NOT DETECTED   Staphylococcus aureus NOT DETECTED NOT DETECTED   Methicillin resistance NOT DETECTED NOT DETECTED   Streptococcus species NOT DETECTED NOT DETECTED   Streptococcus agalactiae NOT DETECTED NOT DETECTED   Streptococcus pneumoniae NOT DETECTED NOT DETECTED   Streptococcus pyogenes NOT DETECTED NOT DETECTED   Acinetobacter baumannii NOT DETECTED NOT DETECTED   Enterobacteriaceae species NOT DETECTED NOT DETECTED   Enterobacter cloacae complex NOT DETECTED NOT DETECTED   Escherichia coli NOT DETECTED NOT DETECTED   Klebsiella oxytoca NOT DETECTED NOT DETECTED   Klebsiella pneumoniae NOT DETECTED NOT DETECTED   Proteus species NOT DETECTED NOT DETECTED   Serratia marcescens NOT DETECTED NOT DETECTED   Haemophilus influenzae NOT DETECTED NOT DETECTED   Neisseria meningitidis NOT DETECTED NOT DETECTED   Pseudomonas aeruginosa NOT DETECTED NOT DETECTED   Candida albicans NOT DETECTED NOT DETECTED   Candida glabrata NOT DETECTED NOT DETECTED   Candida krusei NOT DETECTED NOT DETECTED   Candida parapsilosis NOT DETECTED NOT DETECTED   Candida tropicalis NOT DETECTED NOT DETECTED    Name of physician (or Provider) Contacted: Abrol  Changes to prescribed antibiotics required: D/c Vancomycin - MD wants to continue Rocephin + Acyclovir at this time  Lawson Radar 07/17/2016  11:25 AM

## 2016-07-17 NOTE — Procedures (Signed)
HPI:  76 y/o with MS change and seizure  TECHNICAL SUMMARY:  A multichannel referential and bipolar montage EEG using the standard international 10-20 system was performed on the patient described as intermittently confused, agitated.  Once the patient settles down, there is an 8 Hz occipital dominant rhythm.   Low voltage fast (beta) activity is distributed symmetrically and maximally over the anterior head regions.  Portions of the tracing are contaminated with significant myogenic artifact.  ACTIVATION:  Stepwise photic stimulation and hyperventilation are not performed.  EPILEPTIFORM ACTIVITY:  There were no spikes, sharp waves or paroxysmal activity.  SLEEP:  No sleep was noted.   IMPRESSION:  This EEG demonstrated no focal, hemispheric, or lateralizing features and generally was within ranges of normal.  Portions of the tracing were contaminated with significant amounts of myogenic artifact, which made interpretation difficult during these portions of the tracing.  No epileptiform discharges were noted.  Correlate clinically.

## 2016-07-17 NOTE — Progress Notes (Signed)
RN contacted MRI said said they would come within the hr

## 2016-07-17 NOTE — Progress Notes (Signed)
Patient's wife left and patient became agitated and refusing treatment and meds from nursing staff. Attempted to reassure patient. MD made aware and orders received. Nurse tech sitting at bedside to help reassure patient.

## 2016-07-17 NOTE — Progress Notes (Signed)
Lumbar puncture came back borderline positive. Elevated protein may be due to bloody tap. WBC slightly elevated at 9 cells per microliter. HSV PCR pending on CSF. DDx includes bacterial meningitis and HSV encephalitis. Have started IV acyclovir and ceftriaxone. ID consulted re: possible discontinuation of Zosyn now that she is on ceftriaxone.   Electronically signed: Dr. Kerney Elbe

## 2016-07-17 NOTE — Progress Notes (Signed)
Pharmacy Antibiotic Note  Dennis Combs is a 76 y.o. male admitted on 07/16/2016 with sepsis.  Pharmacy has been consulted for Cefepime dosing for meningitis coverage. MD also adding Acyclovir.   Plan: -Already on vancomycin  -Changing Zosyn to Cefepime 2g IV q8h -MD adding acyclovir  -F/U meningitis work-up  Height: 5\' 10"  (177.8 cm) Weight: 166 lb 8 oz (75.5 kg) IBW/kg (Calculated) : 73  Temp (24hrs), Avg:98.2 F (36.8 C), Min:97.9 F (36.6 C), Max:98.6 F (37 C)   Recent Labs Lab 07/16/16 1357 07/16/16 1407 07/16/16 1755 07/16/16 1806  WBC 13.6*  --  16.1*  --   CREATININE 1.08  --  0.97  --   LATICACIDVEN  --  2.54*  --  2.37*    Estimated Creatinine Clearance: 66.9 mL/min (by C-G formula based on SCr of 0.97 mg/dL).    Allergies  Allergen Reactions  . Ivp Dye [Iodinated Diagnostic Agents] Hives, Itching and Other (See Comments)    Mouth itching  . Metronidazole Diarrhea and Nausea And Vomiting  . Codeine Hives, Itching and Rash  . Percocet [Oxycodone-Acetaminophen] Itching    Dennis Combs 07/17/2016 6:35 AM

## 2016-07-17 NOTE — Progress Notes (Addendum)
[facility] PROGRESS NOTE  Dennis Combs PJA:[id number] DOB: May 25, 1940 DOA: 07/16/2016   PCP: No PCP Per Patient     Assessment/Plan: Active Problems:   GERD   CAD (coronary artery disease)   Altered mental status   Acute encephalopathy  76 yo M with PMHx of HTN, HLD, CAD, DM, dementia here with altered mental status. Patient was brought in via EMS for slurred speech and confusion that started unknown yesterday. He was found to be confused, defecated on himself, unable to speak for 30 minutes. When in the ER patient was found to have a generalized seizure and was loaded with Keppra. Patient is unable to provide any meaningful history. Patient was evaluated by neurology, they  recommended LP. According to ER physicians of CSF fluid appeared to be clear. CSF fluid analysis pending. Patient Keppra 1 g 1 then 500 twice a day. Patient Also Was Found to Have Leukocytosis and Was Given Empiric Antibiotics, ED course  BP 125/74  Pulse 88  Resp 20  Wt 160 lb (72.6 kg)  SpO2 98%  BMI 22.96 kg/m  Patient found to be confused, speech and did return to baseline after a postictal state.However, given continued work-up without apparent infection source and persistent mild tachycardia, decision made to treat empirically with IVF, IV ABX and admit for possible sepsis /neuro work up.    Assessment and plan Altered mental status versus new onset seizure Could be precipitated by underlying infectious process CSF analysis borderline,  DDx includes bacterial meningitis and HSV encephalitis. Neurology started IV acyclovir and ceftriaxone We will continue empiric antibiotics   CT head negative for hydrocephalus, acute infarction  MRI brain pending EDP unable to measure opening pressure, [patient has a history of frontotemporal dementia] Follow HSV PCR on CSF  vitamin B-12 320 , TSH 4.61 Minimize benzodiazepines  New onset seizure Patient received loading dose of Keppra,  continue Keppra 500 mg twice a day EEG demonstrated no focal, hemispheric, or lateralizing features and generally was within ranges of normal Appreciate neurology input Seizure precautions   Leukocytosis, improved 16.1>9.6 Continue to follow results of blood culture 2, urine culture, CSF culture  COPD-stable  Hypertension-continue verapamil    DVT prophylaxsis Lovenox  Code Status:  Full code    Family Communication: Discussed in detail with the patient, all imaging results, lab results explained to the patient   Disposition Plan continue antibiotics      Consultants:  Neurology  Procedures:  None  Antibiotics: Anti-infectives    Start     Dose/Rate Route Frequency Ordered Stop   07/17/16 1100  vancomycin (VANCOCIN) 1,250 mg in sodium chloride 0.9 % 250 mL IVPB     1,250 mg 166.7 mL/hr over 90 Minutes Intravenous Every 24 hours 07/16/16 1553     07/17/16 0630  cefTRIAXone (ROCEPHIN) 2 g in dextrose 5 % 50 mL IVPB  Status:  Discontinued     2 g 100 mL/hr over 30 Minutes Intravenous Every 12 hours 07/17/16 0618 07/17/16 0629   07/17/16 0630  acyclovir (ZOVIRAX) 755 mg in dextrose 5 % 150 mL IVPB     10 mg/kg  75.5 kg 165.1 mL/hr over 60 Minutes Intravenous Every 8 hours 07/17/16 0620 07/27/16 0559   07/17/16 0630  ceFEPIme (MAXIPIME) 2 g in dextrose 5 % 50 mL IVPB     2 g 100 mL/hr over 30 Minutes Intravenous Every 8 hours 07/17/16 0629     07/16/16 2300  piperacillin-tazobactam (ZOSYN) IVPB  3.375 g  Status:  Discontinued     3.375 g 12.5 mL/hr over 240 Minutes Intravenous Every 8 hours 07/16/16 1550 07/17/16 0624   07/16/16 1600  piperacillin-tazobactam (ZOSYN) IVPB 3.375 g     3.375 g 100 mL/hr over 30 Minutes Intravenous  Once 07/16/16 1547 07/16/16 1629   07/16/16 1600  vancomycin (VANCOCIN) IVPB 1000 mg/200 mL premix     1,000 mg 200 mL/hr over 60 Minutes Intravenous  Once 07/16/16 1547 07/16/16 1710   07/16/16 1515  cefTRIAXone (ROCEPHIN) 1 g in  dextrose 5 % 50 mL IVPB  Status:  Discontinued     1 g 100 mL/hr over 30 Minutes Intravenous  Once 07/16/16 1503 07/16/16 1540         HPI/Subjective:  No complaints of HA, neck stiffness or mental change, oriented to self and place ,speech intact  Objective: Vitals:   07/16/16 1936 07/17/16 0009 07/17/16 0333 07/17/16 0821  BP:  (!) 134/59 (!) 116/54 (!) 110/50  Pulse:  84 70 67  Resp:  18 18 18   Temp:  98.1 F (36.7 C) 97.9 F (36.6 C) 97.6 F (36.4 C)  SpO2:  94% 96% 96%  Weight: 75.5 kg (166 lb 8 oz)     Height: 5\' 10"  (1.778 m)       Intake/Output Summary (Last 24 hours) at 07/17/16 0952 Last data filed at 07/17/16 0540  Gross per 24 hour  Intake             3745 ml  Output                0 ml  Net             3745 ml    Exam:  Examination:  General exam: Appears calm and comfortable  Respiratory system: Clear to auscultation. Respiratory effort normal. Cardiovascular system: S1 & S2 heard, RRR. No JVD, murmurs, rubs, gallops or clicks. No pedal edema. Gastrointestinal system: Abdomen is nondistended, soft and nontender. No organomegaly or masses felt. Normal bowel sounds heard. Central nervous system: Alert and oriented. No focal neurological deficits. Extremities: Symmetric 5 x 5 power. Skin: No rashes, lesions or ulcers Psychiatry: Judgement and insight appear normal. Mood & affect appropriate.     Data Reviewed: I have personally reviewed following labs and imaging studies  Micro Results Recent Results (from the past 240 hour(s))  CSF culture     Status: None (Preliminary result)   Collection Time: 07/16/16  5:00 PM  Result Value Ref Range Status   Specimen Description CSF  Final   Special Requests Normal  Final   Gram Stain   Final    WBC PRESENT,BOTH PMN AND MONONUCLEAR NO ORGANISMS SEEN CYTOSPIN SMEAR    Culture PENDING  Incomplete   Report Status PENDING  Incomplete    Radiology Reports Ct Head Wo Contrast  Result Date:  07/16/2016 CLINICAL DATA:  Expressive aphasia beginning yesterday. History of dementia. EXAM: CT HEAD WITHOUT CONTRAST TECHNIQUE: Contiguous axial images were obtained from the base of the skull through the vertex without intravenous contrast. COMPARISON:  10/15/2014 FINDINGS: Brain: No evidence of acute infarction, hemorrhage, hydrocephalus, extra-axial collection or mass lesion/mass effect. Moderate atrophy and mild periventricular chronic microvascular ischemic change. No convincing change since prior. Vascular: Atherosclerotic calcification.  No hyperdense vessel. Skull: Negative Sinuses/Orbits: Polyps in the bilateral upper and anterior nasal cavity, larger on the left and stable from 2016 at least. No acute finding. IMPRESSION: 1. No acute finding. 2. Moderate  atrophy. Electronically Signed   By: Monte Fantasia M.D.   On: 07/16/2016 14:48   Dg Chest Port 1 View  Result Date: 07/16/2016 CLINICAL DATA:  Confusion.  Weakness . EXAM: PORTABLE CHEST 1 VIEW COMPARISON:  10/15/2014 . FINDINGS: Mediastinum and hilar structures are normal. Heart size normal. Calcified pulmonary nodular densities are again noted most likely secondary to prior granulomas disease. No focal infiltrate. Linear density noted over the right upper lung most likely skin fold. Lung markings are noted distal to this linear density. Similar finding noted on prior exam. Surgical clips noted over the gastroesophageal junction . No acute bony abnormality. IMPRESSION: No acute cardiopulmonary disease. Electronically Signed   By: Marcello Moores  Register   On: 07/16/2016 14:58     CBC  Recent Labs Lab 07/16/16 1357 07/16/16 1755 07/17/16 0646  WBC 13.6* 16.1* 9.6  HGB 14.4 13.5 13.2  HCT 44.8 40.7 40.4  PLT 300 269 260  MCV 86.7 86.4 87.3  MCH 27.9 28.7 28.5  MCHC 32.1 33.2 32.7  RDW 14.2 13.9 14.3  LYMPHSABS 0.7  --   --   MONOABS 0.8  --   --   EOSABS 0.0  --   --   BASOSABS 0.0  --   --     Chemistries   Recent Labs Lab  07/16/16 1357 07/16/16 1755 07/17/16 0646  NA 139 139 141  K 3.9 3.8 3.6  CL 103 109 108  CO2 23 22 24   GLUCOSE 148* 199* 113*  BUN 15 14 11   CREATININE 1.08 0.97 1.09  CALCIUM 9.5 8.4* 8.8*  MG  --  2.1  --   AST 26 23 22   ALT 14* 15* 14*  ALKPHOS 84 72 69  BILITOT 1.3* 1.3* 1.5*   ------------------------------------------------------------------------------------------------------------------ estimated creatinine clearance is 59.5 mL/min (by C-G formula based on SCr of 1.09 mg/dL). ------------------------------------------------------------------------------------------------------------------ No results for input(s): HGBA1C in the last 72 hours. ------------------------------------------------------------------------------------------------------------------ No results for input(s): CHOL, HDL, LDLCALC, TRIG, CHOLHDL, LDLDIRECT in the last 72 hours. ------------------------------------------------------------------------------------------------------------------  Recent Labs  07/16/16 1814  TSH 4.612*   ------------------------------------------------------------------------------------------------------------------  Recent Labs  07/16/16 1814  VITAMINB12 320    Coagulation profile No results for input(s): INR, PROTIME in the last 168 hours.  No results for input(s): DDIMER in the last 72 hours.  Cardiac Enzymes No results for input(s): CKMB, TROPONINI, MYOGLOBIN in the last 168 hours.  Invalid input(s): CK ------------------------------------------------------------------------------------------------------------------ Invalid input(s): POCBNP   CBG: No results for input(s): GLUCAP in the last 168 hours.     Studies: Ct Head Wo Contrast  Result Date: 07/16/2016 CLINICAL DATA:  Expressive aphasia beginning yesterday. History of dementia. EXAM: CT HEAD WITHOUT CONTRAST TECHNIQUE: Contiguous axial images were obtained from the base of the skull through  the vertex without intravenous contrast. COMPARISON:  10/15/2014 FINDINGS: Brain: No evidence of acute infarction, hemorrhage, hydrocephalus, extra-axial collection or mass lesion/mass effect. Moderate atrophy and mild periventricular chronic microvascular ischemic change. No convincing change since prior. Vascular: Atherosclerotic calcification.  No hyperdense vessel. Skull: Negative Sinuses/Orbits: Polyps in the bilateral upper and anterior nasal cavity, larger on the left and stable from 2016 at least. No acute finding. IMPRESSION: 1. No acute finding. 2. Moderate atrophy. Electronically Signed   By: Monte Fantasia M.D.   On: 07/16/2016 14:48   Dg Chest Port 1 View  Result Date: 07/16/2016 CLINICAL DATA:  Confusion.  Weakness . EXAM: PORTABLE CHEST 1 VIEW COMPARISON:  10/15/2014 . FINDINGS: Mediastinum and hilar structures are normal. Heart  size normal. Calcified pulmonary nodular densities are again noted most likely secondary to prior granulomas disease. No focal infiltrate. Linear density noted over the right upper lung most likely skin fold. Lung markings are noted distal to this linear density. Similar finding noted on prior exam. Surgical clips noted over the gastroesophageal junction . No acute bony abnormality. IMPRESSION: No acute cardiopulmonary disease. Electronically Signed   By: Marcello Moores  Register   On: 07/16/2016 14:58      Lab Results  Component Value Date   HGBA1C 5.8 06/30/2012   HGBA1C 5.7 04/09/2011   HGBA1C 5.9 04/13/2009   Lab Results  Component Value Date   LDLCALC 114 (H) 03/25/2012   CREATININE 1.09 07/17/2016       Scheduled Meds: . aspirin  81 mg Oral Daily  . diazepam  1 mg Oral TID  . enoxaparin (LOVENOX) injection  40 mg Subcutaneous Q24H  . folic acid  1 mg Oral Daily  . QUEtiapine  50 mg Oral BID  . sodium chloride flush  3 mL Intravenous Q12H  . sodium chloride flush  3 mL Intravenous Q12H  . verapamil  120 mg Oral QHS   Continuous Infusions: .  sodium chloride    . sodium chloride 75 mL/hr at 07/16/16 1948  . acyclovir Stopped (07/17/16 0914)  . ceFEPime (MAXIPIME) IV Stopped (07/17/16 0802)  . levETIRAcetam Stopped (07/17/16 0044)  . vancomycin       LOS: 0 days    Time spent: >30 MINS    Reyne Dumas  [facility] Pager [phone]. If 7PM-7AM, please contact night-coverage at [email or web address], password [facility] 07/17/2016, 9:52 AM  LOS: 0 days

## 2016-07-17 NOTE — Progress Notes (Signed)
Patient is unable to do for MRI because  patient has life alert  arm band on his hand.none of the family [ the spouse and 2 sons] answered RN phone calls to give permission to cut off his personal armband. That is similar to a life alert bracelet. MRI of brain will have to be done tomorrow when family is present to give permission.

## 2016-07-17 NOTE — Progress Notes (Signed)
Subjective: In EEG.  No complaints of HA, neck stiffness or mental change. Comfortable.   Exam: Vitals:   07/17/16 0333 07/17/16 0821  BP: (!) 116/54 (!) 110/50  Pulse: 70 67  Resp: 18 18  Temp: 97.9 F (36.6 C) 97.6 F (36.4 C)    HEENT-  Normocephalic, no lesions, without obvious abnormality.  Normal external eye and conjunctiva.  Normal TM's bilaterally.  Normal auditory canals and external ears. Normal external nose, mucus membranes and septum.  Normal pharynx.    Neuro:  CN: Pupils are equal and round. They are symmetrically reactive from 3-->2 mm. EOMI without nystagmus. Facial sensation is intact to light touch. Face is symmetric at rest with normal strength and mobility. Hearing is intact to conversational voice. Palate elevates symmetrically and uvula is midline. Voice is normal in tone, pitch and quality. Bilateral SCM and trapezii are 5/5. Tongue is midline with normal bulk and mobility.  Motor: moving all extremities antigravity Sensation: Intact to light touch.      Pertinent Labs/Diagnostics: EEG pending Ca 8.8 ALT 14 TSH 4.6  Results for Dennis, Combs (MRN [id number]) as of 07/17/2016 09:40  Ref. Range 07/16/2016 17:00 07/16/2016 17:00  Appearance, CSF Latest Ref Range: CLEAR  HAZY (A) CLEAR  Glucose, CSF Latest Ref Range: 40 - 70 mg/dL 68   RBC Count, CSF Latest Ref Range: 0 /cu mm 1,645 (H) 9 (H)  Segmented Neutrophils-CSF Latest Ref Range: 0 - 6 % FEW RARE  Lymphs, CSF Latest Ref Range: 40 - 80 % FEW FEW  Monocyte-Macrophage-Spinal Fluid Latest Ref Range: 15 - 45 % RARE RARE  Other Cells, CSF Unknown TOO FEW TO COUNT,... TOO FEW TO COUNT,...  Color, CSF Latest Ref Range: COLORLESS  PINK (A) COLORLESS  Supernatant Unknown COLORLESS NOT INDICATED  Total  Protein, CSF Latest Ref Range: 15 - 45 mg/dL 84 (H)   Tube # Unknown 1 4  WBC, CSF Latest Ref Range: 0 - 5 /cu mm 9 (H) 9 (H)    Etta Quill PA-C [facility] [phone]  Impression:   76 year old male with a history of dementia who presented with worsening encephalopathy and had seizure. CSF with mild pleocytosis, elevated protein.  It is possible that this represents encephalitis, though a mild pleocytosis can sometimes be seen immediately postictally.  Recommendations: 1) I agree with empiric coverage pending cultures, PCR 2) increase Keppra to 1 g every 12 3) Neurology will follow.    Roland Rack, MD [facility] [phone]  If 7pm- 7am, please page neurology on call as listed in [place].  07/17/2016, 9:41 AM

## 2016-07-17 NOTE — Evaluation (Signed)
Clinical/Bedside Swallow Evaluation Patient Details  Name: Dennis Combs MRN: [id number] Date of Birth: 04/08/1940  Today's Date: 07/17/2016 Time: SLP Start Time (ACUTE ONLY): 1100 SLP Stop Time (ACUTE ONLY): 1115 SLP Time Calculation (min) (ACUTE ONLY): 15 min  Past Medical History:  Past Medical History:  Diagnosis Date  . AAA (abdominal aortic aneurysm) ([place]) 2007  . Anal fissure   . Anxiety   . CAD (coronary artery disease)    last catheterization 2004, medical management.  . Cardiac arrhythmia   . COPD (chronic obstructive pulmonary disease) ([place])   . Dementia   . Diverticulosis of colon (without mention of hemorrhage)   . Enlargement of lymph nodes   . Gastroparesis   . GERD (gastroesophageal reflux disease)   . 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 Hemorrhoids   . Hiatal hernia   . History of pneumonia   . Hyperglycemia   . Hyperlipidemia   . Perirectal abscess 02/06/12  . Personal history of colonic polyps    hyperplastic polyps (2004) & Tubular adenoma(2000)  . PUD (peptic ulcer disease)   . SVT (supraventricular tachycardia) ([place])   . Tobacco abuse    Past Surgical History:  Past Surgical History:  Procedure Laterality Date  . ABDOMINAL SURGERY  1999   Vagotomy and pyloroplasty due to bleeding ulcer  . APPENDECTOMY    . TONSILLECTOMY     HPI:  76 yo with PMHx of HTN, HLD, CAD, DM, dementia admitted with altered mental status and slurred speech. In ER was found to have a generalized seizure. Neurology recommended an LP. CT no acute finding. MRI pending. Per chart lumbar puncture came back borderline positive with differential diagnosis for bacterial meningitis and HSV encephalitis   Assessment / Plan / Recommendation Clinical Impression  Pt with significant dysfluency first part of evaluation with observable tension, attempting to force phonation. Verbal cues provided to stop, relax and begin again assisted improved fluency and communication with SLP.  Thin, puree and solid textures consumed without wet vocal quality, cough or throat clears. Cognitive deficits largest impact on safe swallowing/feeding. Recommend regular texture, thin liquids, pills whole in applesauce. ST will continue to follow.    SLP Visit Diagnosis: Dysphagia, unspecified (R13.10)    Aspiration Risk   (mild-mod)    Diet Recommendation Regular;Thin liquid   Liquid Administration via: Cup;Straw Medication Administration: Whole meds with puree Supervision: Patient able to self feed Compensations: Slow rate;Small sips/bites;Minimize environmental distractions Postural Changes: Seated upright at 90 degrees    Other  Recommendations Oral Care Recommendations: Oral care BID   Follow up Recommendations None      Frequency and Duration min 2x/week  2 weeks       Prognosis Prognosis for Safe Diet Advancement: Good Barriers to Reach Goals: Cognitive deficits      Swallow Study   General HPI: 76 yo with PMHx of HTN, HLD, CAD, DM, dementia admitted with altered mental status and slurred speech. In ER was found to have a generalized seizure. Neurology recommended an LP. CT no acute finding. MRI pending. Per chart lumbar puncture came back borderline positive with differential diagnosis for bacterial meningitis and HSV encephalitis Type of Study: Bedside Swallow Evaluation Previous Swallow Assessment:  (none) Diet Prior to this Study: NPO Temperature Spikes Noted: No Respiratory Status: Nasal cannula History of Recent Intubation: No Behavior/Cognition: Alert;Cooperative;Pleasant mood;Confused;Requires cueing Oral Cavity Assessment: Within Functional Limits Oral Care Completed by SLP: No Oral Cavity - Dentition: Dentures, top (missing majority lower) Vision: Functional for  self-feeding Self-Feeding Abilities: Able to feed self;Needs set up;Needs assist Patient Positioning: Upright in bed Baseline Vocal Quality: Normal Volitional Cough: Weak Volitional Swallow:  Able to elicit (with delays)    Oral/Motor/Sensory Function Overall Oral Motor/Sensory Function: Within functional limits   Ice Chips Ice chips: Not tested   Thin Liquid Thin Liquid: Within functional limits Presentation: Cup;Straw    Nectar Thick Nectar Thick Liquid: Not tested   Honey Thick Honey Thick Liquid: Not tested   Puree Puree: Within functional limits   Solid   GO   Solid: Within functional limits        Houston Siren 07/17/2016,11:35 AM  Orbie Pyo Colvin Caroli.Ed [employer] [phone]

## 2016-07-18 ENCOUNTER — Encounter (HOSPITAL_COMMUNITY): Payer: Self-pay | Admitting: General Practice

## 2016-07-18 ENCOUNTER — Inpatient Hospital Stay (HOSPITAL_COMMUNITY): Payer: Medicare PPO

## 2016-07-18 DIAGNOSIS — K210 Gastro-esophageal reflux disease with esophagitis: Secondary | ICD-10-CM

## 2016-07-18 DIAGNOSIS — R6510 Systemic inflammatory response syndrome (SIRS) of non-infectious origin without acute organ dysfunction: Secondary | ICD-10-CM

## 2016-07-18 DIAGNOSIS — R569 Unspecified convulsions: Secondary | ICD-10-CM

## 2016-07-18 LAB — BLOOD GAS, ARTERIAL
Acid-Base Excess: 1.3 mmol/L (ref 0.0–2.0)
Bicarbonate: 25.7 mmol/L (ref 20.0–28.0)
DRAWN BY: 331761
O2 CONTENT: 2 L/min
O2 SAT: 97.1 %
PATIENT TEMPERATURE: 98.5
pCO2 arterial: 43.7 mmHg (ref 32.0–48.0)
pH, Arterial: 7.388 (ref 7.350–7.450)
pO2, Arterial: 100 mmHg (ref 83.0–108.0)

## 2016-07-18 LAB — HERPES SIMPLEX VIRUS(HSV) DNA BY PCR
HSV 1 DNA: NEGATIVE
HSV 2 DNA: NEGATIVE

## 2016-07-18 LAB — CULTURE, BLOOD (ROUTINE X 2)
SPECIAL REQUESTS: ADEQUATE
Special Requests: ADEQUATE

## 2016-07-18 MED ORDER — ALUM & MAG HYDROXIDE-SIMETH 200-200-20 MG/5ML PO SUSP
15.0000 mL | Freq: Four times a day (QID) | ORAL | Status: DC | PRN
  Administered 2016-07-18 – 2016-07-19 (×2): 15 mL via ORAL
  Filled 2016-07-18 (×2): qty 30

## 2016-07-18 MED ORDER — HALOPERIDOL LACTATE 5 MG/ML IJ SOLN
2.0000 mg | Freq: Once | INTRAMUSCULAR | Status: AC
  Administered 2016-07-18: 2 mg via INTRAVENOUS
  Filled 2016-07-18: qty 1

## 2016-07-18 MED ORDER — LORAZEPAM 2 MG/ML IJ SOLN
1.0000 mg | Freq: Once | INTRAMUSCULAR | Status: DC
  Filled 2016-07-18: qty 1

## 2016-07-18 MED ORDER — GADOBENATE DIMEGLUMINE 529 MG/ML IV SOLN
15.0000 mL | Freq: Once | INTRAVENOUS | Status: AC | PRN
  Administered 2016-07-18: 15 mL via INTRAVENOUS

## 2016-07-18 MED ORDER — SODIUM CHLORIDE 0.9 % IV SOLN
500.0000 mg | Freq: Two times a day (BID) | INTRAVENOUS | Status: DC
  Administered 2016-07-18 (×2): 500 mg via INTRAVENOUS
  Filled 2016-07-18 (×3): qty 5

## 2016-07-18 MED ORDER — PROMETHAZINE HCL 25 MG RE SUPP
12.5000 mg | Freq: Once | RECTAL | Status: AC
  Administered 2016-07-18: 12.5 mg via RECTAL
  Filled 2016-07-18: qty 1

## 2016-07-18 NOTE — Progress Notes (Signed)
PT Cancellation Note  Patient Details Name: Dennis Combs MRN: [id number] DOB: 1940-05-17   Cancelled Treatment:    Reason Eval/Treat Not Completed: Medical issues which prohibited therapy;Patient not medically ready. Per RN, pt has been given medications to help him relax for MRI which makes it difficult for him to participate at this time. PT will check back tomorrow AM after pt has had MRI and is able to safely mobilize OOB.    Scheryl Marten PT, DPT  [phone]  07/18/2016, 3:03 PM

## 2016-07-18 NOTE — Progress Notes (Addendum)
[facility] PROGRESS NOTE  Dennis Combs XBD:[id number] DOB: 05-Oct-1940 DOA: 07/16/2016   PCP: No PCP Per Patient     Assessment/Plan: Active Problems:   GERD   CAD (coronary artery disease)   Altered mental status   Acute encephalopathy  76 yo M with PMHx of HTN, HLD, CAD, DM, dementia here with altered mental status. Patient was brought in via EMS for slurred speech and confusion that started unknown yesterday. He was found to be confused, defecated on himself, unable to speak for 30 minutes. When in the ER patient was found to have a generalized seizure and was loaded with Keppra. Patient is unable to provide any meaningful history. Patient was evaluated by neurology, they  recommended LP. According to ER physicians of CSF fluid appeared to be clear. CSF fluid analysis pending. Patient Keppra 1 g 1 then 500 twice a day. Patient Also Was Found to Have Leukocytosis and Was Given Empiric Antibiotics, ED course  BP 125/74  Pulse 88  Resp 20  Wt 160 lb (72.6 kg)  SpO2 98%  BMI 22.96 kg/m  Patient found to be confused, speech and did return to baseline after a postictal state.However, given continued work-up without apparent infection source and persistent mild tachycardia, decision made to treat empirically with IVF, IV ABX and admit for possible sepsis /neuro work up.    Assessment and plan Altered mental status versus new onset seizure Could be precipitated by underlying infectious process CSF analysis borderline,CSF with mild pleocytosis, elevated protein.  DDx includes bacterial meningitis and HSV encephalitis. Neurology started IV acyclovir and ceftriaxone, day #3 empiric coverage pending cultures, PCR CT head negative for hydrocephalus, acute infarction  MRI brain No acute intracranial abnormality identified. No abnormal enhancement of the brain.  EDP unable to measure opening pressure, [patient has a history of frontotemporal dementia] Follow HSV PCR on  CSF vitamin B-12 320 , TSH 4.61 Minimize benzodiazepines once MRI is completed  ABG  ok   New onset seizure Patient received loading dose of Keppra, continue Keppra 500 mg twice a day EEG demonstrated no focal, hemispheric, or lateralizing features and generally was within ranges of normal Appreciate neurology input Seizure precautions,     Leukocytosis, improved 16.1>9.6 Continue to follow results of blood culture 2, urine culture, CSF culture  COPD-stable  Hypertension-continue verapamil    DVT prophylaxsis Lovenox  Code Status:  Full code    Family Communication: Discussed in detail with the patient, all imaging results, lab results explained to the patient   Disposition Plan continue antibiotics as  Above,MRI pending        Consultants:  Neurology  Procedures:  None  Antibiotics: Anti-infectives    Start     Dose/Rate Route Frequency Ordered Stop   07/17/16 2000  cefTRIAXone (ROCEPHIN) 2 g in dextrose 5 % 50 mL IVPB     2 g 100 mL/hr over 30 Minutes Intravenous Every 12 hours 07/17/16 1143     07/17/16 1100  vancomycin (VANCOCIN) 1,250 mg in sodium chloride 0.9 % 250 mL IVPB  Status:  Discontinued     1,250 mg 166.7 mL/hr over 90 Minutes Intravenous Every 24 hours 07/16/16 1553 07/17/16 1146   07/17/16 0630  cefTRIAXone (ROCEPHIN) 2 g in dextrose 5 % 50 mL IVPB  Status:  Discontinued     2 g 100 mL/hr over 30 Minutes Intravenous Every 12 hours 07/17/16 0618 07/17/16 0629   07/17/16 0630  acyclovir (ZOVIRAX) 755 mg in  dextrose 5 % 150 mL IVPB     10 mg/kg  75.5 kg 165.1 mL/hr over 60 Minutes Intravenous Every 8 hours 07/17/16 0620 07/27/16 0559   07/17/16 0630  ceFEPIme (MAXIPIME) 2 g in dextrose 5 % 50 mL IVPB  Status:  Discontinued     2 g 100 mL/hr over 30 Minutes Intravenous Every 8 hours 07/17/16 0629 07/17/16 0956   07/16/16 2300  piperacillin-tazobactam (ZOSYN) IVPB 3.375 g  Status:  Discontinued     3.375 g 12.5 mL/hr over 240 Minutes  Intravenous Every 8 hours 07/16/16 1550 07/17/16 0624   07/16/16 1600  piperacillin-tazobactam (ZOSYN) IVPB 3.375 g     3.375 g 100 mL/hr over 30 Minutes Intravenous  Once 07/16/16 1547 07/16/16 1629   07/16/16 1600  vancomycin (VANCOCIN) IVPB 1000 mg/200 mL premix     1,000 mg 200 mL/hr over 60 Minutes Intravenous  Once 07/16/16 1547 07/16/16 1710   07/16/16 1515  cefTRIAXone (ROCEPHIN) 1 g in dextrose 5 % 50 mL IVPB  Status:  Discontinued     1 g 100 mL/hr over 30 Minutes Intravenous  Once 07/16/16 1503 07/16/16 1540         HPI/Subjective: Very confused last night ,drowsy after getting valium and ativan   Objective: Vitals:   07/17/16 1425 07/17/16 1623 07/17/16 2235 07/18/16 0537  BP: (!) 110/53 (!) 110/58 (!) 104/57 110/74  Pulse: 64  84 82  Resp: 18  20 16   Temp: 97.6 F (36.4 C)  98.2 F (36.8 C) 98.5 F (36.9 C)  TempSrc:      SpO2: 98%  96% 98%  Weight:      Height:        Intake/Output Summary (Last 24 hours) at 07/18/16 1027 Last data filed at 07/18/16 2774  Gross per 24 hour  Intake          2796.65 ml  Output              350 ml  Net          2446.65 ml    Exam:  Examination:  General exam: Appears calm and comfortable  Respiratory system: Clear to auscultation. Respiratory effort normal. Cardiovascular system: S1 & S2 heard, RRR. No JVD, murmurs, rubs, gallops or clicks. No pedal edema. Gastrointestinal system: Abdomen is nondistended, soft and nontender. No organomegaly or masses felt. Normal bowel sounds heard. Central nervous system: Alert and oriented. No focal neurological deficits. Extremities: Symmetric 5 x 5 power. Skin: No rashes, lesions or ulcers Psychiatry: Judgement and insight appear normal. Mood & affect appropriate.     Data Reviewed: I have personally reviewed following labs and imaging studies  Micro Results Recent Results (from the past 240 hour(s))  Blood culture (routine x 2)     Status: None (Preliminary result)    Collection Time: 07/16/16  3:25 PM  Result Value Ref Range Status   Specimen Description BLOOD RIGHT ANTECUBITAL  Final   Special Requests   Final    BOTTLES DRAWN AEROBIC AND ANAEROBIC Blood Culture adequate volume   Culture  Setup Time   Final    GRAM POSITIVE COCCI IN CLUSTERS ANAEROBIC BOTTLE ONLY CRITICAL VALUE NOTED.  VALUE IS CONSISTENT WITH PREVIOUSLY REPORTED AND CALLED VALUE.    Culture GRAM POSITIVE COCCI  Final   Report Status PENDING  Incomplete  Blood culture (routine x 2)     Status: None (Preliminary result)   Collection Time: 07/16/16  3:30 PM  Result Value Ref  Range Status   Specimen Description BLOOD LEFT ANTECUBITAL  Final   Special Requests   Final    BOTTLES DRAWN AEROBIC AND ANAEROBIC Blood Culture adequate volume   Culture  Setup Time   Final    GRAM POSITIVE COCCI IN CLUSTERS IN BOTH AEROBIC AND ANAEROBIC BOTTLES Organism ID to follow CRITICAL RESULT CALLED TO, READ BACK BY AND VERIFIED WITH: EDaisy Lazar.D. 11:20 07/17/16 ([email or web address])    Culture GRAM POSITIVE COCCI  Final   Report Status PENDING  Incomplete  Blood Culture ID Panel (Reflexed)     Status: Abnormal   Collection Time: 07/16/16  3:30 PM  Result Value Ref Range Status   Enterococcus species NOT DETECTED NOT DETECTED Final   Listeria monocytogenes NOT DETECTED NOT DETECTED Final   Staphylococcus species DETECTED (A) NOT DETECTED Final    Comment: Methicillin (oxacillin) susceptible coagulase negative staphylococcus. Possible blood culture contaminant (unless isolated from more than one blood culture draw or clinical case suggests pathogenicity). No antibiotic treatment is indicated for blood  culture contaminants. CRITICAL RESULT CALLED TO, READ BACK BY AND VERIFIED WITH: Alvino Chapel.D. 11:20 07/17/16 ([email or web address])    Staphylococcus aureus NOT DETECTED NOT DETECTED Final   Methicillin resistance NOT DETECTED NOT DETECTED Final   Streptococcus species NOT DETECTED NOT DETECTED Final    Streptococcus agalactiae NOT DETECTED NOT DETECTED Final   Streptococcus pneumoniae NOT DETECTED NOT DETECTED Final   Streptococcus pyogenes NOT DETECTED NOT DETECTED Final   Acinetobacter baumannii NOT DETECTED NOT DETECTED Final   Enterobacteriaceae species NOT DETECTED NOT DETECTED Final   Enterobacter cloacae complex NOT DETECTED NOT DETECTED Final   Escherichia coli NOT DETECTED NOT DETECTED Final   Klebsiella oxytoca NOT DETECTED NOT DETECTED Final   Klebsiella pneumoniae NOT DETECTED NOT DETECTED Final   Proteus species NOT DETECTED NOT DETECTED Final   Serratia marcescens NOT DETECTED NOT DETECTED Final   Haemophilus influenzae NOT DETECTED NOT DETECTED Final   Neisseria meningitidis NOT DETECTED NOT DETECTED Final   Pseudomonas aeruginosa NOT DETECTED NOT DETECTED Final   Candida albicans NOT DETECTED NOT DETECTED Final   Candida glabrata NOT DETECTED NOT DETECTED Final   Candida krusei NOT DETECTED NOT DETECTED Final   Candida parapsilosis NOT DETECTED NOT DETECTED Final   Candida tropicalis NOT DETECTED NOT DETECTED Final  Urine culture     Status: None   Collection Time: 07/16/16  4:18 PM  Result Value Ref Range Status   Specimen Description URINE, CATHETERIZED  Final   Special Requests NONE  Final   Culture NO GROWTH  Final   Report Status 07/17/2016 FINAL  Final  CSF culture     Status: None (Preliminary result)   Collection Time: 07/16/16  5:00 PM  Result Value Ref Range Status   Specimen Description CSF  Final   Special Requests Normal  Final   Gram Stain   Final    WBC PRESENT,BOTH PMN AND MONONUCLEAR NO ORGANISMS SEEN CYTOSPIN SMEAR    Culture NO GROWTH < 24 HOURS  Final   Report Status PENDING  Incomplete    Radiology Reports Ct Head Wo Contrast  Result Date: 07/16/2016 CLINICAL DATA:  Expressive aphasia beginning yesterday. History of dementia. EXAM: CT HEAD WITHOUT CONTRAST TECHNIQUE: Contiguous axial images were obtained from the base of the skull  through the vertex without intravenous contrast. COMPARISON:  10/15/2014 FINDINGS: Brain: No evidence of acute infarction, hemorrhage, hydrocephalus, extra-axial collection or mass lesion/mass effect. Moderate atrophy and mild periventricular chronic microvascular ischemic  change. No convincing change since prior. Vascular: Atherosclerotic calcification.  No hyperdense vessel. Skull: Negative Sinuses/Orbits: Polyps in the bilateral upper and anterior nasal cavity, larger on the left and stable from 2016 at least. No acute finding. IMPRESSION: 1. No acute finding. 2. Moderate atrophy. Electronically Signed   By: Monte Fantasia M.D.   On: 07/16/2016 14:48   Dg Chest Port 1 View  Result Date: 07/16/2016 CLINICAL DATA:  Confusion.  Weakness . EXAM: PORTABLE CHEST 1 VIEW COMPARISON:  10/15/2014 . FINDINGS: Mediastinum and hilar structures are normal. Heart size normal. Calcified pulmonary nodular densities are again noted most likely secondary to prior granulomas disease. No focal infiltrate. Linear density noted over the right upper lung most likely skin fold. Lung markings are noted distal to this linear density. Similar finding noted on prior exam. Surgical clips noted over the gastroesophageal junction . No acute bony abnormality. IMPRESSION: No acute cardiopulmonary disease. Electronically Signed   By: Marcello Moores  Register   On: 07/16/2016 14:58   Dg Abd Portable 1v  Result Date: 07/18/2016 CLINICAL DATA:  Vomiting EXAM: PORTABLE ABDOMEN - 1 VIEW COMPARISON:  None. FINDINGS: Gas throughout mildly prominent large and small bowel loops. No evidence of bowel obstruction. No organomegaly or free air. No suspicious calcification. IMPRESSION: Nonspecific bowel gas pattern without evidence of obstruction. No acute findings. Electronically Signed   By: Rolm Baptise M.D.   On: 07/18/2016 08:16     CBC  Recent Labs Lab 07/16/16 1357 07/16/16 1755 07/17/16 0646  WBC 13.6* 16.1* 9.6  HGB 14.4 13.5 13.2  HCT  44.8 40.7 40.4  PLT 300 269 260  MCV 86.7 86.4 87.3  MCH 27.9 28.7 28.5  MCHC 32.1 33.2 32.7  RDW 14.2 13.9 14.3  LYMPHSABS 0.7  --   --   MONOABS 0.8  --   --   EOSABS 0.0  --   --   BASOSABS 0.0  --   --     Chemistries   Recent Labs Lab 07/16/16 1357 07/16/16 1755 07/17/16 0646  NA 139 139 141  K 3.9 3.8 3.6  CL 103 109 108  CO2 23 22 24   GLUCOSE 148* 199* 113*  BUN 15 14 11   CREATININE 1.08 0.97 1.09  CALCIUM 9.5 8.4* 8.8*  MG  --  2.1  --   AST 26 23 22   ALT 14* 15* 14*  ALKPHOS 84 72 69  BILITOT 1.3* 1.3* 1.5*   ------------------------------------------------------------------------------------------------------------------ estimated creatinine clearance is 59.5 mL/min (by C-G formula based on SCr of 1.09 mg/dL). ------------------------------------------------------------------------------------------------------------------ No results for input(s): HGBA1C in the last 72 hours. ------------------------------------------------------------------------------------------------------------------ No results for input(s): CHOL, HDL, LDLCALC, TRIG, CHOLHDL, LDLDIRECT in the last 72 hours. ------------------------------------------------------------------------------------------------------------------  Recent Labs  07/16/16 1814  TSH 4.612*   ------------------------------------------------------------------------------------------------------------------  Recent Labs  07/16/16 1814  VITAMINB12 320    Coagulation profile No results for input(s): INR, PROTIME in the last 168 hours.  No results for input(s): DDIMER in the last 72 hours.  Cardiac Enzymes No results for input(s): CKMB, TROPONINI, MYOGLOBIN in the last 168 hours.  Invalid input(s): CK ------------------------------------------------------------------------------------------------------------------ Invalid input(s): POCBNP   CBG:  Recent Labs Lab 07/17/16 1617  GLUCAP 142*        Studies: Ct Head Wo Contrast  Result Date: 07/16/2016 CLINICAL DATA:  Expressive aphasia beginning yesterday. History of dementia. EXAM: CT HEAD WITHOUT CONTRAST TECHNIQUE: Contiguous axial images were obtained from the base of the skull through the vertex without intravenous contrast. COMPARISON:  10/15/2014  FINDINGS: Brain: No evidence of acute infarction, hemorrhage, hydrocephalus, extra-axial collection or mass lesion/mass effect. Moderate atrophy and mild periventricular chronic microvascular ischemic change. No convincing change since prior. Vascular: Atherosclerotic calcification.  No hyperdense vessel. Skull: Negative Sinuses/Orbits: Polyps in the bilateral upper and anterior nasal cavity, larger on the left and stable from 2016 at least. No acute finding. IMPRESSION: 1. No acute finding. 2. Moderate atrophy. Electronically Signed   By: Monte Fantasia M.D.   On: 07/16/2016 14:48   Dg Chest Port 1 View  Result Date: 07/16/2016 CLINICAL DATA:  Confusion.  Weakness . EXAM: PORTABLE CHEST 1 VIEW COMPARISON:  10/15/2014 . FINDINGS: Mediastinum and hilar structures are normal. Heart size normal. Calcified pulmonary nodular densities are again noted most likely secondary to prior granulomas disease. No focal infiltrate. Linear density noted over the right upper lung most likely skin fold. Lung markings are noted distal to this linear density. Similar finding noted on prior exam. Surgical clips noted over the gastroesophageal junction . No acute bony abnormality. IMPRESSION: No acute cardiopulmonary disease. Electronically Signed   By: Marcello Moores  Register   On: 07/16/2016 14:58   Dg Abd Portable 1v  Result Date: 07/18/2016 CLINICAL DATA:  Vomiting EXAM: PORTABLE ABDOMEN - 1 VIEW COMPARISON:  None. FINDINGS: Gas throughout mildly prominent large and small bowel loops. No evidence of bowel obstruction. No organomegaly or free air. No suspicious calcification. IMPRESSION: Nonspecific bowel gas  pattern without evidence of obstruction. No acute findings. Electronically Signed   By: Rolm Baptise M.D.   On: 07/18/2016 08:16      Lab Results  Component Value Date   HGBA1C 5.8 06/30/2012   HGBA1C 5.7 04/09/2011   HGBA1C 5.9 04/13/2009   Lab Results  Component Value Date   LDLCALC 114 (H) 03/25/2012   CREATININE 1.09 07/17/2016       Scheduled Meds: . aspirin  81 mg Oral Daily  . chlorhexidine  15 mL Mouth Rinse BID  . diazepam  1 mg Oral TID  . enoxaparin (LOVENOX) injection  40 mg Subcutaneous Q24H  . folic acid  1 mg Oral Daily  . haloperidol lactate  2 mg Intravenous Once  . LORazepam  1 mg Intravenous Once  . LORazepam  1 mg Oral Once  . mouth rinse  15 mL Mouth Rinse q12n4p  . QUEtiapine  50 mg Oral BID  . sodium chloride flush  3 mL Intravenous Q12H  . sodium chloride flush  3 mL Intravenous Q12H  . verapamil  120 mg Oral QHS   Continuous Infusions: . sodium chloride    . sodium chloride 75 mL/hr at 07/18/16 0331  . acyclovir Stopped (07/18/16 0631)  . cefTRIAXone (ROCEPHIN)  IV 2 g (07/18/16 3086)  . levETIRAcetam Stopped (07/17/16 2256)     LOS: 1 day    Time spent: >30 MINS    Reyne Dumas  [facility] Pager [phone]. If 7PM-7AM, please contact night-coverage at [email or web address], password [facility] 07/18/2016, 10:27 AM  LOS: 1 day

## 2016-07-18 NOTE — Progress Notes (Signed)
Came to obtain ABG & RN states that pt. Is gone for testing.

## 2016-07-18 NOTE — Progress Notes (Signed)
  Speech Language Pathology Treatment: Dysphagia  Patient Details Name: Dennis Combs MRN: [id number] DOB: 21-Dec-1940 Today's Date: 07/18/2016 Time: 1583-0940 SLP Time Calculation (min) (ACUTE ONLY): 13 min  Assessment / Plan / Recommendation Clinical Impression  Pt drowsy requiring moderate tactile and verbal cues to attend- agitated last night and assume he may have received sedating meds last night. Prolonged mastication, bolus formation and oral transit given verbal cues to clear oral cavity before next bite. No s/s aspiration. Continue regular and thin liquids. No further ST needed.   HPI HPI: 76 yo with PMHx of HTN, HLD, CAD, DM, dementia admitted with altered mental status and slurred speech. In ER was found to have a generalized seizure. Neurology recommended an LP. CT no acute finding. MRI pending. Per chart lumbar puncture came back borderline positive with differential diagnosis for bacterial meningitis and HSV encephalitis      SLP Plan  All goals met       Recommendations  Diet recommendations: Regular;Thin liquid Liquids provided via: Cup;Straw Medication Administration: Whole meds with puree Supervision: Patient able to self feed;Full supervision/cueing for compensatory strategies Compensations: Slow rate;Small sips/bites;Minimize environmental distractions Postural Changes and/or Swallow Maneuvers: Seated upright 90 degrees                Oral Care Recommendations: Oral care BID Follow up Recommendations: None SLP Visit Diagnosis: Dysphagia, unspecified (R13.10) Plan: All goals met       GO                Dennis Combs 07/18/2016, 9:22 AM  Orbie Pyo Colvin Caroli.Ed [employer] [phone]

## 2016-07-18 NOTE — Progress Notes (Signed)
Subjective: No complaints this morning. Patient is very drowsy. Patient is able to follow all commands.   Exam: Vitals:   07/17/16 2235 07/18/16 0537  BP: (!) 104/57 110/74  Pulse: 84 82  Resp: 20 16  Temp: 98.2 F (36.8 C) 98.5 F (36.9 C)    HEENT-  Normocephalic, no lesions, without obvious abnormality.  Normal external eye and conjunctiva.  Normal TM's bilaterally.  Normal auditory canals and external ears. Normal external nose, mucus membranes and septum.  Normal pharynx.    Neuro:  CN: Pupils are equal and round. They are symmetrically reactive from 3-->2 mm. EOMI without nystagmus. Facial sensation is intact to light touch. Face is symmetric at rest with normal strength and mobility. Hearing is intact to conversational voice. Palate elevates symmetrically and uvula is midline. Voice is normal in tone, pitch and quality. Bilateral SCM and trapezii are 5/5. Tongue is midline with normal bulk and mobility.  Motor: Normal bulk, tone, and strength. 5/5 throughout. No drift.      Pertinent Labs/Diagnostics: EEG--This EEG demonstrated no focal, hemispheric, or lateralizing features and generally was within ranges of normal.  Portions of the tracing were contaminated with significant amounts of myogenic artifact, which made interpretation difficult during these portions of the tracing.  No epileptiform discharges were noted.  Etta Quill PA-C [facility] [phone]  Impression:  76 year old male with a history of dementia who presented with worsening encephalopathy and had seizure. CSF with mild pleocytosis, elevated protein.  It is possible that this represents encephalitis, though a mild pleocytosis can sometimes be seen immediately postictally.  EEG did not show any epileptiform activity. At present time patient is very drowsy however he did receive 2 doses of Valium yesterday in addition he receive Zofran at 1 AM and Phenergan at 2 AM which very well could be a  contributing factor to him being drowsy. His Keppra was increased from 500 mg to 1 g every 12 hours after some initial concern on the EEG, but after further review, I don't think tha this is necessary.   Recommendations: 1) I agree with empiric coverage pending cultures, PCR 2) decrease Keppra to 500mg  BID 3) Neurology will follow.   Roland Rack, MD [facility] [phone]  If 7pm- 7am, please page neurology on call as listed in [place].   07/18/2016, 9:21 AM

## 2016-07-19 LAB — CBC
HCT: 38 % — ABNORMAL LOW (ref 39.0–52.0)
Hemoglobin: 12 g/dL — ABNORMAL LOW (ref 13.0–17.0)
MCH: 28 pg (ref 26.0–34.0)
MCHC: 31.6 g/dL (ref 30.0–36.0)
MCV: 88.8 fL (ref 78.0–100.0)
PLATELETS: 241 10*3/uL (ref 150–400)
RBC: 4.28 MIL/uL (ref 4.22–5.81)
RDW: 14.4 % (ref 11.5–15.5)
WBC: 8.5 10*3/uL (ref 4.0–10.5)

## 2016-07-19 LAB — COMPREHENSIVE METABOLIC PANEL
ALK PHOS: 54 U/L (ref 38–126)
ALT: 14 U/L — AB (ref 17–63)
AST: 19 U/L (ref 15–41)
Albumin: 2.8 g/dL — ABNORMAL LOW (ref 3.5–5.0)
Anion gap: 9 (ref 5–15)
BILIRUBIN TOTAL: 0.7 mg/dL (ref 0.3–1.2)
BUN: <5 mg/dL — ABNORMAL LOW (ref 6–20)
CALCIUM: 8.3 mg/dL — AB (ref 8.9–10.3)
CO2: 29 mmol/L (ref 22–32)
CREATININE: 0.88 mg/dL (ref 0.61–1.24)
Chloride: 105 mmol/L (ref 101–111)
Glucose, Bld: 86 mg/dL (ref 65–99)
Potassium: 3 mmol/L — ABNORMAL LOW (ref 3.5–5.1)
Sodium: 143 mmol/L (ref 135–145)
Total Protein: 5.5 g/dL — ABNORMAL LOW (ref 6.5–8.1)

## 2016-07-19 MED ORDER — SODIUM CHLORIDE 0.9 % IV SOLN: 30.0000 meq | Freq: Once | INTRAVENOUS | Status: DC

## 2016-07-19 MED ORDER — POTASSIUM CHLORIDE CRYS ER 20 MEQ PO TBCR: 40.0000 meq | EXTENDED_RELEASE_TABLET | Freq: Once | ORAL | 0 refills | Status: AC

## 2016-07-19 MED ORDER — LACOSAMIDE 50 MG PO TABS
100.0000 mg | ORAL_TABLET | Freq: Two times a day (BID) | ORAL | Status: DC
  Administered 2016-07-19: 100 mg via ORAL
  Filled 2016-07-19: qty 2

## 2016-07-19 MED ORDER — POTASSIUM CHLORIDE CRYS ER 20 MEQ PO TBCR
40.0000 meq | EXTENDED_RELEASE_TABLET | Freq: Once | ORAL | Status: AC
  Administered 2016-07-19: 40 meq via ORAL
  Filled 2016-07-19: qty 2

## 2016-07-19 MED ORDER — LACOSAMIDE 100 MG PO TABS: 100.0000 mg | ORAL_TABLET | Freq: Two times a day (BID) | ORAL | 2 refills | Status: AC

## 2016-07-19 MED ORDER — LEVETIRACETAM 500 MG PO TABS: 500.0000 mg | ORAL_TABLET | Freq: Two times a day (BID) | ORAL | Status: DC

## 2016-07-19 NOTE — Evaluation (Signed)
Physical Therapy Evaluation Patient Details Name: Dennis Combs MRN: [id number] DOB: Sep 05, 1940 Today's Date: 07/19/2016   History of Present Illness  Pt is a 76 yo male admitted through ED with AMS. Pt was found to have leukocytosis and seizure activity. PMH significant for HTN, HLD, CAD, DM2, dementia.   Clinical Impression  Pt presents with the above diagnosis and below deficits. Prior to admission, pt was living with his wife in an apartment. Pt's wife is his PCG and per previous notes she is having increasing difficulty caring for him due to agitation. Pt requires Min A for a majority of mobility this session and is at a high risk for falls due to cognitive deficits. Pt would benefit from continued acute services in order to address the below deficits prior to DC.     Follow Up Recommendations Home health PT;Supervision/Assistance - 24 hour    Equipment Recommendations  Rolling walker with 5" wheels    Recommendations for Other Services       Precautions / Restrictions Precautions Precautions: Fall Precaution Comments: Dementia Restrictions Weight Bearing Restrictions: No      Mobility  Bed Mobility Overal bed mobility: Needs Assistance Bed Mobility: Supine to Sit     Supine to sit: Min assist     General bed mobility comments: min A to bring trunk upright at EOB  Transfers Overall transfer level: Needs assistance Equipment used: None Transfers: Sit to/from Stand Sit to Stand: Min assist         General transfer comment: Min A from EOB  Ambulation/Gait Ambulation/Gait assistance: Min assist Ambulation Distance (Feet): 20 Feet Assistive device: 1 person hand held assist Gait Pattern/deviations: Step-to pattern;Decreased step length - right;Decreased step length - left Gait velocity: decreased Gait velocity interpretation: Below normal speed for age/gender General Gait Details: Min A for safety without an AD.   Stairs            Wheelchair  Mobility    Modified Rankin (Stroke Patients Only)       Balance Overall balance assessment: Needs assistance Sitting-balance support: No upper extremity supported;Feet supported Sitting balance-Leahy Scale: Fair     Standing balance support: Single extremity supported;During functional activity Standing balance-Leahy Scale: Fair                               Pertinent Vitals/Pain Pain Assessment: No/denies pain    Home Living Family/patient expects to be discharged to:: Unsure Living Arrangements: Spouse/significant other               Additional Comments: pt lives with wife. Per previous notes, pt's wife was having increased difficulty caring for the patient. Family is considering placement in ALF or memory care.     Prior Function Level of Independence: Needs assistance   Gait / Transfers Assistance Needed: Unsure, pt reports not using an AD.   ADL's / Homemaking Assistance Needed: No family present to update        Hand Dominance   Dominant Hand: Right    Extremity/Trunk Assessment   Upper Extremity Assessment Upper Extremity Assessment: Defer to OT evaluation    Lower Extremity Assessment Lower Extremity Assessment: Generalized weakness    Cervical / Trunk Assessment Cervical / Trunk Assessment: Normal  Communication   Communication: No difficulties  Cognition Arousal/Alertness: Awake/alert Behavior During Therapy: WFL for tasks assessed/performed Overall Cognitive Status: No family/caregiver present to determine baseline cognitive functioning  General Comments: H/O dementia and cognitive deficits      General Comments      Exercises     Assessment/Plan    PT Assessment Patient needs continued PT services  PT Problem List Decreased strength;Decreased activity tolerance;Decreased balance;Decreased mobility;Decreased cognition;Decreased knowledge of use of DME       PT  Treatment Interventions DME instruction;Gait training;Functional mobility training;Therapeutic activities;Therapeutic exercise;Balance training    PT Goals (Current goals can be found in the Care Plan section)  Acute Rehab PT Goals Patient Stated Goal: to go home PT Goal Formulation: Patient unable to participate in goal setting Time For Goal Achievement: 07/26/16 Potential to Achieve Goals: Fair    Frequency Min 3X/week   Barriers to discharge        Co-evaluation               End of Session Equipment Utilized During Treatment: Gait belt Activity Tolerance: Patient tolerated treatment well Patient left: in chair;with call bell/phone within reach;with chair alarm set Nurse Communication: Mobility status PT Visit Diagnosis: Muscle weakness (generalized) (M62.81);Difficulty in walking, not elsewhere classified (R26.2)    Time: 7829-5621 PT Time Calculation (min) (ACUTE ONLY): 25 min   Charges:   PT Evaluation $PT Eval Moderate Complexity: 1 Procedure PT Treatments $Gait Training: 8-22 mins   PT G Codes:        Scheryl Marten PT, DPT  [phone]   Jacqulyn Liner Sloan Leiter 07/19/2016, 1:24 PM

## 2016-07-19 NOTE — Discharge Summary (Signed)
Physician Discharge Summary  Dennis Combs MRN: [id number] DOB/AGE: 1941-03-04 76 y.o.  PCP: Dennis Forward, MD   Admit date: 07/16/2016 Discharge date: 07/19/2016  Discharge Diagnoses:    Active Problems:   GERD   CAD (coronary artery disease)   Altered mental status   Acute encephalopathy   Seizures (HCC)   SIRS (systemic inflammatory response syndrome) ([place])    Follow-up recommendations Follow-up with PCP in 3-5 days , including all  additional recommended appointments as below Follow-up CBC, CMP in 3-5 days Patient would benefit from follow-up in [facility] for new onset seizures  PCP requested to follow-up on blood culture results on 4/28      Current Discharge Medication List    START taking these medications   Details  lacosamide 100 MG TABS Take 1 tablet (100 mg total) by mouth 2 (two) times daily. Qty: 60 tablet, Refills: 2    potassium chloride SA (K-DUR,KLOR-CON) 20 MEQ tablet Take 2 tablets (40 mEq total) by mouth once. Qty: 30 tablet, Refills: 0      CONTINUE these medications which have NOT CHANGED   Details  aspirin 81 MG tablet Take 81 mg by mouth daily.    atorvastatin (LIPITOR) 20 MG tablet Take 20 mg by mouth daily.    CHOLECALCIFEROL PO Take 1 tablet by mouth See admin instructions. Takes occasionally    QUEtiapine (SEROQUEL) 25 MG tablet Take 50 mg by mouth 2 (two) times daily.    verapamil (VERELAN PM) 120 MG 24 hr capsule Take 120 mg by mouth at bedtime.    cyanocobalamin (,VITAMIN B-12,) 1000 MCG/ML injection Inject 1 mL (1,000 mcg total) into the muscle every 30 (thirty) days. Inject once, every 4 weeks. Qty: 1 mL, Refills: 6    fluocinonide cream (LIDEX) 4.16 % Apply 1 application topically as needed (for facial rash).      STOP taking these medications     Naproxen Sod-Diphenhydramine (ALEVE PM) 220-25 MG TABS      diazepam (VALIUM) 2 MG tablet      folic acid (FOLVITE) 1 MG tablet           Discharge Condition: Stable   Discharge Instructions Get Medicines reviewed and adjusted: Please take all your medications with you for your next visit with your Primary MD  Please request your Primary MD to go over all hospital tests and procedure/radiological results at the follow up, please ask your Primary MD to get all Hospital records sent to his/her office.  If you experience worsening of your admission symptoms, develop shortness of breath, life threatening emergency, suicidal or homicidal thoughts you must seek medical attention immediately by calling 911 or calling your MD immediately if symptoms less severe.  You must read complete instructions/literature along with all the possible adverse reactions/side effects for all the Medicines you take and that have been prescribed to you. Take any new Medicines after you have completely understood and accpet all the possible adverse reactions/side effects.   Do not drive when taking Pain medications.   Do not take more than prescribed Pain, Sleep and Anxiety Medications  Special Instructions: If you have smoked or chewed Tobacco in the last 2 yrs please stop smoking, stop any regular Alcohol and or any Recreational drug use.  Wear Seat belts while driving.  Please note  You were cared for by a hospitalist during your hospital stay. Once you are discharged, your primary care physician will handle any further medical issues. Please note that NO  REFILLS for any discharge medications will be authorized once you are discharged, as it is imperative that you return to your primary care physician (or establish a relationship with a primary care physician if you do not have one) for your aftercare needs so that they can reassess your need for medications and monitor your lab values.  Discharge Instructions    Diet - low sodium heart healthy    Complete by:  As directed    Increase activity slowly    Complete by:  As directed         Allergies  Allergen Reactions  . Ivp Dye [Iodinated Diagnostic Agents] Hives, Itching and Other (See Comments)    Mouth itching  . Metronidazole Diarrhea and Nausea And Vomiting  . Codeine Hives, Itching and Rash  . Percocet [Oxycodone-Acetaminophen] Itching      Disposition: Home with home health   Consults:  Neurology    Significant Diagnostic Studies:  Ct Head Wo Contrast  Result Date: 07/16/2016 CLINICAL DATA:  Expressive aphasia beginning yesterday. History of dementia. EXAM: CT HEAD WITHOUT CONTRAST TECHNIQUE: Contiguous axial images were obtained from the base of the skull through the vertex without intravenous contrast. COMPARISON:  10/15/2014 FINDINGS: Brain: No evidence of acute infarction, hemorrhage, hydrocephalus, extra-axial collection or mass lesion/mass effect. Moderate atrophy and mild periventricular chronic microvascular ischemic change. No convincing change since prior. Vascular: Atherosclerotic calcification.  No hyperdense vessel. Skull: Negative Sinuses/Orbits: Polyps in the bilateral upper and anterior nasal cavity, larger on the left and stable from 2016 at least. No acute finding. IMPRESSION: 1. No acute finding. 2. Moderate atrophy. Electronically Signed   By: Marnee Spring M.D.   On: 07/16/2016 14:48   Mr [name] [place] Contrast  Result Date: 07/18/2016 CLINICAL DATA:  76 y/o  M; generalized seizure. EXAM: MRI HEAD WITHOUT AND WITH CONTRAST TECHNIQUE: Multiplanar, multiecho pulse sequences of the brain and surrounding structures were obtained without and with intravenous contrast. CONTRAST:  56mL MULTIHANCE GADOBENATE DIMEGLUMINE 529 MG/ML IV SOLN COMPARISON:  07/16/2016 CT of the head. FINDINGS: Brain: Moderate motion degradation on multiple pulse sequences. No acute infarction, hemorrhage, hydrocephalus, extra-axial collection or mass lesion. Foci of T2 FLAIR hyperintense signal abnormality in periventricular and subcortical white matter are compatible  with moderate chronic microvascular ischemic changes. Moderate brain parenchymal volume loss. No structural abnormality of the brain identified. Normal midline structures including pituitary, complete corpus callosum, and vermis. No cerebellar tonsillar herniation. After administration of intravenous contrast there is no abnormal enhancement of the brain. No definite asymmetry in hippocampal size or signal. Vascular: Normal flow voids. Skull and upper cervical spine: Normal marrow signal. Sinuses/Orbits: Negative. Other: None. IMPRESSION: 1. No acute intracranial abnormality identified. No abnormal enhancement of the brain. 2. Moderate chronic microvascular ischemic changes and moderate parenchymal volume loss of the brain. 3. No structural cause of seizure identified. Electronically Signed   By: Mitzi Hansen M.D.   On: 07/18/2016 14:19   Dg Chest Port 1 View  Result Date: 07/16/2016 CLINICAL DATA:  Confusion.  Weakness . EXAM: PORTABLE CHEST 1 VIEW COMPARISON:  10/15/2014 . FINDINGS: Mediastinum and hilar structures are normal. Heart size normal. Calcified pulmonary nodular densities are again noted most likely secondary to prior granulomas disease. No focal infiltrate. Linear density noted over the right upper lung most likely skin fold. Lung markings are noted distal to this linear density. Similar finding noted on prior exam. Surgical clips noted over the gastroesophageal junction . No acute bony abnormality. IMPRESSION: No acute  cardiopulmonary disease. Electronically Signed   By: Maisie Fus  Register   On: 07/16/2016 14:58   Dg Abd Portable 1v  Result Date: 07/18/2016 CLINICAL DATA:  Vomiting EXAM: PORTABLE ABDOMEN - 1 VIEW COMPARISON:  None. FINDINGS: Gas throughout mildly prominent large and small bowel loops. No evidence of bowel obstruction. No organomegaly or free air. No suspicious calcification. IMPRESSION: Nonspecific bowel gas pattern without evidence of obstruction. No acute findings.  Electronically Signed   By: Charlett Nose M.D.   On: 07/18/2016 08:16      Filed Weights   07/16/16 1338 07/16/16 1936  Weight: 72.6 kg (160 lb) 75.5 kg (166 lb 8 oz)     Microbiology: Recent Results (from the past 240 hour(s))  Blood culture (routine x 2)     Status: Abnormal   Collection Time: 07/16/16  3:25 PM  Result Value Ref Range Status   Specimen Description BLOOD RIGHT ANTECUBITAL  Final   Special Requests   Final    BOTTLES DRAWN AEROBIC AND ANAEROBIC Blood Culture adequate volume   Culture  Setup Time   Final    GRAM POSITIVE COCCI IN CLUSTERS ANAEROBIC BOTTLE ONLY CRITICAL VALUE NOTED.  VALUE IS CONSISTENT WITH PREVIOUSLY REPORTED AND CALLED VALUE.    Culture (A)  Final    STAPHYLOCOCCUS SPECIES (COAGULASE NEGATIVE) THE SIGNIFICANCE OF ISOLATING THIS ORGANISM FROM A SINGLE SET OF BLOOD CULTURES WHEN MULTIPLE SETS ARE DRAWN IS UNCERTAIN. PLEASE NOTIFY THE MICROBIOLOGY DEPARTMENT WITHIN ONE WEEK IF SPECIATION AND SENSITIVITIES ARE REQUIRED.    Report Status 07/18/2016 FINAL  Final  Blood culture (routine x 2)     Status: Abnormal   Collection Time: 07/16/16  3:30 PM  Result Value Ref Range Status   Specimen Description BLOOD LEFT ANTECUBITAL  Final   Special Requests   Final    BOTTLES DRAWN AEROBIC AND ANAEROBIC Blood Culture adequate volume   Culture  Setup Time   Final    GRAM POSITIVE COCCI IN CLUSTERS IN BOTH AEROBIC AND ANAEROBIC BOTTLES CRITICAL RESULT CALLED TO, READ BACK BY AND VERIFIED WITH: Veronda Prude.D. 11:20 07/17/16 ([email or web address])    Culture (A)  Final    STAPHYLOCOCCUS SPECIES (COAGULASE NEGATIVE) THE SIGNIFICANCE OF ISOLATING THIS ORGANISM FROM A SINGLE SET OF BLOOD CULTURES WHEN MULTIPLE SETS ARE DRAWN IS UNCERTAIN. PLEASE NOTIFY THE MICROBIOLOGY DEPARTMENT WITHIN ONE WEEK IF SPECIATION AND SENSITIVITIES ARE REQUIRED.    Report Status 07/18/2016 FINAL  Final  Blood Culture ID Panel (Reflexed)     Status: Abnormal   Collection Time: 07/16/16  3:30  PM  Result Value Ref Range Status   Enterococcus species NOT DETECTED NOT DETECTED Final   Listeria monocytogenes NOT DETECTED NOT DETECTED Final   Staphylococcus species DETECTED (A) NOT DETECTED Final    Comment: Methicillin (oxacillin) susceptible coagulase negative staphylococcus. Possible blood culture contaminant (unless isolated from more than one blood culture draw or clinical case suggests pathogenicity). No antibiotic treatment is indicated for blood  culture contaminants. CRITICAL RESULT CALLED TO, READ BACK BY AND VERIFIED WITH: Veronda Prude.D. 11:20 07/17/16 ([email or web address])    Staphylococcus aureus NOT DETECTED NOT DETECTED Final   Methicillin resistance NOT DETECTED NOT DETECTED Final   Streptococcus species NOT DETECTED NOT DETECTED Final   Streptococcus agalactiae NOT DETECTED NOT DETECTED Final   Streptococcus pneumoniae NOT DETECTED NOT DETECTED Final   Streptococcus pyogenes NOT DETECTED NOT DETECTED Final   Acinetobacter baumannii NOT DETECTED NOT DETECTED Final   Enterobacteriaceae species NOT DETECTED NOT DETECTED Final  Enterobacter cloacae complex NOT DETECTED NOT DETECTED Final   Escherichia coli NOT DETECTED NOT DETECTED Final   Klebsiella oxytoca NOT DETECTED NOT DETECTED Final   Klebsiella pneumoniae NOT DETECTED NOT DETECTED Final   Proteus species NOT DETECTED NOT DETECTED Final   Serratia marcescens NOT DETECTED NOT DETECTED Final   Haemophilus influenzae NOT DETECTED NOT DETECTED Final   Neisseria meningitidis NOT DETECTED NOT DETECTED Final   Pseudomonas aeruginosa NOT DETECTED NOT DETECTED Final   Candida albicans NOT DETECTED NOT DETECTED Final   Candida glabrata NOT DETECTED NOT DETECTED Final   Candida krusei NOT DETECTED NOT DETECTED Final   Candida parapsilosis NOT DETECTED NOT DETECTED Final   Candida tropicalis NOT DETECTED NOT DETECTED Final  Urine culture     Status: None   Collection Time: 07/16/16  4:18 PM  Result Value Ref Range Status    Specimen Description URINE, CATHETERIZED  Final   Special Requests NONE  Final   Culture NO GROWTH  Final   Report Status 07/17/2016 FINAL  Final  CSF culture     Status: None (Preliminary result)   Collection Time: 07/16/16  5:00 PM  Result Value Ref Range Status   Specimen Description CSF  Final   Special Requests Normal  Final   Gram Stain   Final    WBC PRESENT,BOTH PMN AND MONONUCLEAR NO ORGANISMS SEEN CYTOSPIN SMEAR    Culture NO GROWTH 3 DAYS  Final   Report Status PENDING  Incomplete       Blood Culture    Component Value Date/Time   SDES CSF 07/16/2016 1700   SPECREQUEST Normal 07/16/2016 1700   CULT NO GROWTH 3 DAYS 07/16/2016 1700   REPTSTATUS PENDING 07/16/2016 1700      Labs: Results for orders placed or performed during the hospital encounter of 07/16/16 (from the past 48 hour(s))  Glucose, capillary     Status: Abnormal   Collection Time: 07/17/16  4:17 PM  Result Value Ref Range   Glucose-Capillary 142 (H) 65 - 99 mg/dL  Blood gas, arterial     Status: None   Collection Time: 07/18/16  3:01 PM  Result Value Ref Range   O2 Content 2.0 L/min   Delivery systems NASAL CANNULA    pH, Arterial 7.388 7.350 - 7.450   pCO2 arterial 43.7 32.0 - 48.0 mmHg   pO2, Arterial 100 83.0 - 108.0 mmHg   Bicarbonate 25.7 20.0 - 28.0 mmol/L   Acid-Base Excess 1.3 0.0 - 2.0 mmol/L   O2 Saturation 97.1 %   Patient temperature 98.5    Collection site BRACHIAL ARTERY    Drawn by [id number]    Sample type ARTERIAL DRAW    Allens test (pass/fail) PASS PASS  Comprehensive metabolic panel     Status: Abnormal   Collection Time: 07/19/16  4:34 AM  Result Value Ref Range   Sodium 143 135 - 145 mmol/L   Potassium 3.0 (L) 3.5 - 5.1 mmol/L   Chloride 105 101 - 111 mmol/L   CO2 29 22 - 32 mmol/L   Glucose, Bld 86 65 - 99 mg/dL   BUN <5 (L) 6 - 20 mg/dL   Creatinine, Ser 0.88 0.61 - 1.24 mg/dL   Calcium 8.3 (L) 8.9 - 10.3 mg/dL   Total Protein 5.5 (L) 6.5 - 8.1 g/dL   Albumin  2.8 (L) 3.5 - 5.0 g/dL   AST 19 15 - 41 U/L   ALT 14 (L) 17 - 63 U/L   Alkaline  Phosphatase 54 38 - 126 U/L   Total Bilirubin 0.7 0.3 - 1.2 mg/dL   GFR calc non Af Amer >60 >60 mL/min   GFR calc Af Amer >60 >60 mL/min    Comment: (NOTE) The eGFR has been calculated using the CKD EPI equation. This calculation has not been validated in all clinical situations. eGFR's persistently <60 mL/min signify possible Chronic Kidney Disease.    Anion gap 9 5 - 15  CBC     Status: Abnormal   Collection Time: 07/19/16  4:34 AM  Result Value Ref Range   WBC 8.5 4.0 - 10.5 K/uL   RBC 4.28 4.22 - 5.81 MIL/uL   Hemoglobin 12.0 (L) 13.0 - 17.0 g/dL   HCT 38.0 (L) 39.0 - 52.0 %   MCV 88.8 78.0 - 100.0 fL   MCH 28.0 26.0 - 34.0 pg   MCHC 31.6 30.0 - 36.0 g/dL   RDW 14.4 11.5 - 15.5 %   Platelets 241 150 - 400 K/uL     Lipid Panel     Component Value Date/Time   CHOL 173 03/25/2012 0843   TRIG 58.0 03/25/2012 0843   TRIG 70 07/18/2009   HDL 47.50 03/25/2012 0843   CHOLHDL 4 03/25/2012 0843   VLDL 11.6 03/25/2012 0843   LDLCALC 114 (H) 03/25/2012 0843   LDLDIRECT 152.8 04/09/2011 1114     Lab Results  Component Value Date   HGBA1C 5.8 06/30/2012   HGBA1C 5.7 04/09/2011   HGBA1C 5.9 04/13/2009       HPI :  76 yo M with PMHx of HTN, HLD, CAD, DM, dementia here with altered mental status. Patient was brought in via EMS for slurred speech and confusion that started unknown yesterday. He was found to be confused, defecated on himself, unable to speak for 30 minutes. When in the ER patient was found to have a generalized seizure and was loaded with Keppra. Patient is unable to provide any meaningful history. Patient was evaluated by neurology, they recommended LP. According to ER physicians of CSF fluid appeared to be clear. CSF fluid analysis pending. Patient Keppra 1 g 1 then 500 twice a day. Patient Also Was Found to Have Leukocytosis and Was Given Empiric Antibiotics, ED course  BP  125/74  Pulse 88  Resp 20  Wt 160 lb (72.6 kg)  SpO2 98%  BMI 22.96 kg/m  Patient found to be confused, speech and did return to baseline after a postictal state.However, given continued work-up without apparent infection source and persistent mild tachycardia, decision made to treat empirically with IVF, IV ABX and admit for possible sepsis /neuro work up.   HOSPITAL COURSE:   Altered mental status likely secondary to new onset seizure Patient has been adequately ruled out for meningitis, suspect etiopathic new onset seizure in the setting of worsening dementia CSF analysis borderline,CSF with mild pleocytosis, elevated protein.  DDx includes bacterial meningitis and HSV encephalitis. Neurology started IV acyclovir and ceftriaxone, day #4. Discussed with Dr. Kyung Bacca PCR, CSF culture negative, okay to discontinue both antibiotics CT head negative for hydrocephalus, acute infarction MRI brain No acute intracranial abnormality identified. No abnormal enhancement of the brain.  EDP unable to measure opening pressure, [patient has a history of frontotemporal dementia] vitamin B-12 320 , TSH 4.61 Minimize benzodiazepines once MRI is completed ABG appears to be okay PT OT evaluation-wife requesting home health for now   New onset seizure Patient received loading dose of Keppra, continue Keppra 500 mg twice a day  EEG demonstrated no focal, hemispheric, or lateralizing features and generally was within ranges of normal Appreciate neurology input Seizure precautions,    Leukocytosis, improved 16.1>9.6 Continue to follow results of blood culture 2, urine culture, CSF culture Blood culture positive for staph coag negative, will repeat blood cultures to ensure clearance, this was likely a contaminant  COPD-stable  Hypertension-continue verapamil  Dementia-continue Seroquel, Valium,     Discharge Exam:   Blood pressure (!) 116/54, pulse 85, temperature 98.2 F  (36.8 C), resp. rate 20, height '5\' 10"'$  (1.778 m), weight 75.5 kg (166 lb 8 oz), SpO2 97 %.  General exam:  intermittently confused   Respiratory system: Clear to auscultation. Respiratory effort normal. Cardiovascular system: S1 & S2 heard, RRR. No JVD, murmurs, rubs, gallops or clicks. No pedal edema. Gastrointestinal system: Abdomen is nondistended, soft and nontender. No organomegaly or masses felt. Normal bowel sounds heard. Central nervous system: Alert and oriented. No focal neurological deficits. Extremities: Symmetric 5 x 5 power. Skin: No rashes, lesions or ulcers Psychiatry: Judgement and insight impaired. Mood & affect appropriate.      Follow-up Information    Dennis Forward, MD. Call.   Specialty:  Cardiology Contact information: [address] [place] [address] [phone]        Lenor Coffin, MD. Call.   Specialty:  Neurology Why:  Hospital follow-up in one to 2 weeks, new onset seizures Contact information: [address] [place] [address] [phone]           Signed: Reyne Dumas 07/19/2016, 11:35 AM        Time spent >45 mins

## 2016-07-19 NOTE — Progress Notes (Signed)
Subjective: Patient is much more awake today.  He is extremely irritable.  Exam: Vitals:   07/18/16 2040 07/19/16 0523  BP: (!) 103/44 (!) 116/54  Pulse: 60 85  Resp: 20 20  Temp: 98.2 F (36.8 C) 98.2 F (36.8 C)   Gen: In bed, NAD Resp: non-labored breathing, no acute distress Abd: soft, nt  Neuro: MS: Awake, alert, oriented to month, year, but not place. CN: Pupils equal round and reactive to light, visual fields full Motor: He has good strength in all extremities Sensory: Intact to light touch   Impression: 76 year old male with new onset seizures in the setting of dementia and possible viral process. The pleocytosis seen on lumbar puncture could've been indicative of a mild viral meningitis, but is nonspecific in the setting of seizure. Treatment in any case would be supportive.  With negative cultures and HSV negative, I don't feel strongly at all about continuing antibiotics or antivirals.  I am concerned that his irritability may be at least in part due to [place] and I would favor changing to Vimpat.  Recommendations: 1) change Keppra to Vimpat IV 100 mg twice a day 2) discontinue antibiotics 3) no further recommendations at this time, neurology will sign off, please call with further questions or concerns.  Roland Rack, MD [facility] [phone]  If 7pm- 7am, please page neurology on call as listed in [place].

## 2016-07-19 NOTE — Progress Notes (Signed)
Patient discharged home with Home health/rolling walker. Discharge instructions given and patient spouse verbalized understanding. Patient spouse in a hurry to leave the hospital and has refused to wait for [facility] to deliver the walker. Patient spouse understands the importance of DME and verbalized understating to education provided.Patient and spouse left without the walker.

## 2016-07-19 NOTE — Progress Notes (Signed)
[facility] PROGRESS NOTE  TAAJ HURLBUT [id number] DOB: 1941-02-18 DOA: 07/16/2016   PCP: Charolette Forward, MD     Assessment/Plan: Active Problems:   GERD   CAD (coronary artery disease)   Altered mental status   Acute encephalopathy   Seizures (HCC)   SIRS (systemic inflammatory response syndrome) ([place])  76 yo M with PMHx of HTN, HLD, CAD, DM, dementia here with altered mental status. Patient was brought in via EMS for slurred speech and confusion that started unknown yesterday. He was found to be confused, defecated on himself, unable to speak for 30 minutes. When in the ER patient was found to have a generalized seizure and was loaded with Keppra. Patient is unable to provide any meaningful history. Patient was evaluated by neurology, they  recommended LP. According to ER physicians of CSF fluid appeared to be clear. CSF fluid analysis pending. Patient Keppra 1 g 1 then 500 twice a day. Patient Also Was Found to Have Leukocytosis and Was Given Empiric Antibiotics, ED course  BP 125/74  Pulse 88  Resp 20  Wt 160 lb (72.6 kg)  SpO2 98%  BMI 22.96 kg/m  Patient found to be confused, speech and did return to baseline after a postictal state.However, given continued work-up without apparent infection source and persistent mild tachycardia, decision made to treat empirically with IVF, IV ABX and admit for possible sepsis /neuro work up.    Assessment and plan Altered mental status  likely secondary to new onset seizure Patient has been adequately ruled out for meningitis CSF analysis borderline,CSF with mild pleocytosis, elevated protein.  DDx includes bacterial meningitis and HSV encephalitis. Neurology started IV acyclovir and ceftriaxone, day #4. Discussed with Dr. Kyung Bacca PCR, CSF culture negative, okay to discontinue both antibiotics CT head negative for hydrocephalus, acute infarction MRI brain No acute intracranial abnormality identified. No  abnormal enhancement of the brain.  EDP unable to measure opening pressure, [patient has a history of frontotemporal dementia] vitamin B-12 320 , TSH 4.61 Minimize benzodiazepines once MRI is completed ABG appears to be okay PT OT evaluation-wife requesting SNF unable to take care of him due to advanced dementia   New onset seizure Patient received loading dose of Keppra, continue Keppra 500 mg twice a day EEG demonstrated no focal, hemispheric, or lateralizing features and generally was within ranges of normal Appreciate neurology input Seizure precautions,     Leukocytosis, improved 16.1>9.6 Continue to follow results of blood culture 2, urine culture, CSF culture Blood culture positive for staph coag negative, will repeat blood cultures to ensure clearance, this was likely a contaminant  COPD-stable  Hypertension-continue verapamil  Dementia-continue Seroquel, Valium,    DVT prophylaxsis Lovenox  Code Status:  Full code    Family Communication: Discussed in detail with the patient, all imaging results, lab results explained to the patient   Disposition Plan  - anticipate patient will need SNF      Consultants:  Neurology  Procedures:  None  Antibiotics: Anti-infectives    Start     Dose/Rate Route Frequency Ordered Stop   07/17/16 2000  cefTRIAXone (ROCEPHIN) 2 g in dextrose 5 % 50 mL IVPB     2 g 100 mL/hr over 30 Minutes Intravenous Every 12 hours 07/17/16 1143     07/17/16 1100  vancomycin (VANCOCIN) 1,250 mg in sodium chloride 0.9 % 250 mL IVPB  Status:  Discontinued     1,250 mg 166.7 mL/hr over 90 Minutes Intravenous Every  24 hours 07/16/16 1553 07/17/16 1146   07/17/16 0630  cefTRIAXone (ROCEPHIN) 2 g in dextrose 5 % 50 mL IVPB  Status:  Discontinued     2 g 100 mL/hr over 30 Minutes Intravenous Every 12 hours 07/17/16 0618 07/17/16 0629   07/17/16 0630  acyclovir (ZOVIRAX) 755 mg in dextrose 5 % 150 mL IVPB     10 mg/kg  75.5 kg 165.1  mL/hr over 60 Minutes Intravenous Every 8 hours 07/17/16 0620 07/27/16 0559   07/17/16 0630  ceFEPIme (MAXIPIME) 2 g in dextrose 5 % 50 mL IVPB  Status:  Discontinued     2 g 100 mL/hr over 30 Minutes Intravenous Every 8 hours 07/17/16 0629 07/17/16 0956   07/16/16 2300  piperacillin-tazobactam (ZOSYN) IVPB 3.375 g  Status:  Discontinued     3.375 g 12.5 mL/hr over 240 Minutes Intravenous Every 8 hours 07/16/16 1550 07/17/16 0624   07/16/16 1600  piperacillin-tazobactam (ZOSYN) IVPB 3.375 g     3.375 g 100 mL/hr over 30 Minutes Intravenous  Once 07/16/16 1547 07/16/16 1629   07/16/16 1600  vancomycin (VANCOCIN) IVPB 1000 mg/200 mL premix     1,000 mg 200 mL/hr over 60 Minutes Intravenous  Once 07/16/16 1547 07/16/16 1710   07/16/16 1515  cefTRIAXone (ROCEPHIN) 1 g in dextrose 5 % 50 mL IVPB  Status:  Discontinued     1 g 100 mL/hr over 30 Minutes Intravenous  Once 07/16/16 1503 07/16/16 1540         HPI/Subjective: Very confused last night ,drowsy after getting valium and ativan   Objective: Vitals:   07/18/16 0537 07/18/16 1921 07/18/16 2040 07/19/16 0523  BP: 110/74 (!) 108/42 (!) 103/44 (!) 116/54  Pulse: 82 69 60 85  Resp: 16 16 20 20   Temp: 98.5 F (36.9 C) 97.7 F (36.5 C) 98.2 F (36.8 C) 98.2 F (36.8 C)  TempSrc:  Oral Oral   SpO2: 98%  98% 97%  Weight:      Height:        Intake/Output Summary (Last 24 hours) at 07/19/16 0751 Last data filed at 07/19/16 0548  Gross per 24 hour  Intake           3035.3 ml  Output              600 ml  Net           2435.3 ml    Exam:  Examination:  General exam: Appears calm and comfortable  Respiratory system: Clear to auscultation. Respiratory effort normal. Cardiovascular system: S1 & S2 heard, RRR. No JVD, murmurs, rubs, gallops or clicks. No pedal edema. Gastrointestinal system: Abdomen is nondistended, soft and nontender. No organomegaly or masses felt. Normal bowel sounds heard. Central nervous system: Alert  and oriented. No focal neurological deficits. Extremities: Symmetric 5 x 5 power. Skin: No rashes, lesions or ulcers Psychiatry: Judgement and insight appear normal. Mood & affect appropriate.     Data Reviewed: I have personally reviewed following labs and imaging studies  Micro Results Recent Results (from the past 240 hour(s))  Blood culture (routine x 2)     Status: Abnormal   Collection Time: 07/16/16  3:25 PM  Result Value Ref Range Status   Specimen Description BLOOD RIGHT ANTECUBITAL  Final   Special Requests   Final    BOTTLES DRAWN AEROBIC AND ANAEROBIC Blood Culture adequate volume   Culture  Setup Time   Final    GRAM POSITIVE COCCI IN CLUSTERS  ANAEROBIC BOTTLE ONLY CRITICAL VALUE NOTED.  VALUE IS CONSISTENT WITH PREVIOUSLY REPORTED AND CALLED VALUE.    Culture (A)  Final    STAPHYLOCOCCUS SPECIES (COAGULASE NEGATIVE) THE SIGNIFICANCE OF ISOLATING THIS ORGANISM FROM A SINGLE SET OF BLOOD CULTURES WHEN MULTIPLE SETS ARE DRAWN IS UNCERTAIN. PLEASE NOTIFY THE MICROBIOLOGY DEPARTMENT WITHIN ONE WEEK IF SPECIATION AND SENSITIVITIES ARE REQUIRED.    Report Status 07/18/2016 FINAL  Final  Blood culture (routine x 2)     Status: Abnormal   Collection Time: 07/16/16  3:30 PM  Result Value Ref Range Status   Specimen Description BLOOD LEFT ANTECUBITAL  Final   Special Requests   Final    BOTTLES DRAWN AEROBIC AND ANAEROBIC Blood Culture adequate volume   Culture  Setup Time   Final    GRAM POSITIVE COCCI IN CLUSTERS IN BOTH AEROBIC AND ANAEROBIC BOTTLES CRITICAL RESULT CALLED TO, READ BACK BY AND VERIFIED WITH: Alvino Chapel.D. 11:20 07/17/16 ([email or web address])    Culture (A)  Final    STAPHYLOCOCCUS SPECIES (COAGULASE NEGATIVE) THE SIGNIFICANCE OF ISOLATING THIS ORGANISM FROM A SINGLE SET OF BLOOD CULTURES WHEN MULTIPLE SETS ARE DRAWN IS UNCERTAIN. PLEASE NOTIFY THE MICROBIOLOGY DEPARTMENT WITHIN ONE WEEK IF SPECIATION AND SENSITIVITIES ARE REQUIRED.    Report Status 07/18/2016  FINAL  Final  Blood Culture ID Panel (Reflexed)     Status: Abnormal   Collection Time: 07/16/16  3:30 PM  Result Value Ref Range Status   Enterococcus species NOT DETECTED NOT DETECTED Final   Listeria monocytogenes NOT DETECTED NOT DETECTED Final   Staphylococcus species DETECTED (A) NOT DETECTED Final    Comment: Methicillin (oxacillin) susceptible coagulase negative staphylococcus. Possible blood culture contaminant (unless isolated from more than one blood culture draw or clinical case suggests pathogenicity). No antibiotic treatment is indicated for blood  culture contaminants. CRITICAL RESULT CALLED TO, READ BACK BY AND VERIFIED WITH: Alvino Chapel.D. 11:20 07/17/16 ([email or web address])    Staphylococcus aureus NOT DETECTED NOT DETECTED Final   Methicillin resistance NOT DETECTED NOT DETECTED Final   Streptococcus species NOT DETECTED NOT DETECTED Final   Streptococcus agalactiae NOT DETECTED NOT DETECTED Final   Streptococcus pneumoniae NOT DETECTED NOT DETECTED Final   Streptococcus pyogenes NOT DETECTED NOT DETECTED Final   Acinetobacter baumannii NOT DETECTED NOT DETECTED Final   Enterobacteriaceae species NOT DETECTED NOT DETECTED Final   Enterobacter cloacae complex NOT DETECTED NOT DETECTED Final   Escherichia coli NOT DETECTED NOT DETECTED Final   Klebsiella oxytoca NOT DETECTED NOT DETECTED Final   Klebsiella pneumoniae NOT DETECTED NOT DETECTED Final   Proteus species NOT DETECTED NOT DETECTED Final   Serratia marcescens NOT DETECTED NOT DETECTED Final   Haemophilus influenzae NOT DETECTED NOT DETECTED Final   Neisseria meningitidis NOT DETECTED NOT DETECTED Final   Pseudomonas aeruginosa NOT DETECTED NOT DETECTED Final   Candida albicans NOT DETECTED NOT DETECTED Final   Candida glabrata NOT DETECTED NOT DETECTED Final   Candida krusei NOT DETECTED NOT DETECTED Final   Candida parapsilosis NOT DETECTED NOT DETECTED Final   Candida tropicalis NOT DETECTED NOT DETECTED Final   Urine culture     Status: None   Collection Time: 07/16/16  4:18 PM  Result Value Ref Range Status   Specimen Description URINE, CATHETERIZED  Final   Special Requests NONE  Final   Culture NO GROWTH  Final   Report Status 07/17/2016 FINAL  Final  CSF culture     Status: None (Preliminary result)   Collection Time:  07/16/16  5:00 PM  Result Value Ref Range Status   Specimen Description CSF  Final   Special Requests Normal  Final   Gram Stain   Final    WBC PRESENT,BOTH PMN AND MONONUCLEAR NO ORGANISMS SEEN CYTOSPIN SMEAR    Culture NO GROWTH 2 DAYS  Final   Report Status PENDING  Incomplete    Radiology Reports Ct Head Wo Contrast  Result Date: 07/16/2016 CLINICAL DATA:  Expressive aphasia beginning yesterday. History of dementia. EXAM: CT HEAD WITHOUT CONTRAST TECHNIQUE: Contiguous axial images were obtained from the base of the skull through the vertex without intravenous contrast. COMPARISON:  10/15/2014 FINDINGS: Brain: No evidence of acute infarction, hemorrhage, hydrocephalus, extra-axial collection or mass lesion/mass effect. Moderate atrophy and mild periventricular chronic microvascular ischemic change. No convincing change since prior. Vascular: Atherosclerotic calcification.  No hyperdense vessel. Skull: Negative Sinuses/Orbits: Polyps in the bilateral upper and anterior nasal cavity, larger on the left and stable from 2016 at least. No acute finding. IMPRESSION: 1. No acute finding. 2. Moderate atrophy. Electronically Signed   By: Monte Fantasia M.D.   On: 07/16/2016 14:48   Dennis Combs TD Contrast  Result Date: 07/18/2016 CLINICAL DATA:  Dennis Combs  M; generalized seizure. EXAM: MRI HEAD WITHOUT AND WITH CONTRAST TECHNIQUE: Multiplanar, multiecho pulse sequences of the brain and surrounding structures were obtained without and with intravenous contrast. CONTRAST:  77mL MULTIHANCE GADOBENATE DIMEGLUMINE 529 MG/ML IV SOLN COMPARISON:  07/16/2016 CT of the head. FINDINGS: Brain:  Moderate motion degradation on multiple pulse sequences. No acute infarction, hemorrhage, hydrocephalus, extra-axial collection or mass lesion. Foci of T2 FLAIR hyperintense signal abnormality in periventricular and subcortical white matter are compatible with moderate chronic microvascular ischemic changes. Moderate brain parenchymal volume loss. No structural abnormality of the brain identified. Normal midline structures including pituitary, complete corpus callosum, and vermis. No cerebellar tonsillar herniation. After administration of intravenous contrast there is no abnormal enhancement of the brain. No definite asymmetry in hippocampal size or signal. Vascular: Normal flow voids. Skull and upper cervical spine: Normal marrow signal. Sinuses/Orbits: Negative. Other: None. IMPRESSION: 1. No acute intracranial abnormality identified. No abnormal enhancement of the brain. 2. Moderate chronic microvascular ischemic changes and moderate parenchymal volume loss of the brain. 3. No structural cause of seizure identified. Electronically Signed   By: Kristine Garbe M.D.   On: 07/18/2016 14:19   Dg Chest Port 1 View  Result Date: 07/16/2016 CLINICAL DATA:  Confusion.  Weakness . EXAM: PORTABLE CHEST 1 VIEW COMPARISON:  10/15/2014 . FINDINGS: Mediastinum and hilar structures are normal. Heart size normal. Calcified pulmonary nodular densities are again noted most likely secondary to prior granulomas disease. No focal infiltrate. Linear density noted over the right upper lung most likely skin fold. Lung markings are noted distal to this linear density. Similar finding noted on prior exam. Surgical clips noted over the gastroesophageal junction . No acute bony abnormality. IMPRESSION: No acute cardiopulmonary disease. Electronically Signed   By: Marcello Moores  Register   On: 07/16/2016 14:58   Dg Abd Portable 1v  Result Date: 07/18/2016 CLINICAL DATA:  Vomiting EXAM: PORTABLE ABDOMEN - 1 VIEW COMPARISON:  None.  FINDINGS: Gas throughout mildly prominent large and small bowel loops. No evidence of bowel obstruction. No organomegaly or free air. No suspicious calcification. IMPRESSION: Nonspecific bowel gas pattern without evidence of obstruction. No acute findings. Electronically Signed   By: Rolm Baptise M.D.   On: 07/18/2016 08:16     CBC  Recent Labs Lab  07/16/16 1357 07/16/16 1755 07/17/16 0646 07/19/16 0434  WBC 13.6* 16.1* 9.6 8.5  HGB 14.4 13.5 13.2 12.0*  HCT 44.8 40.7 40.4 38.0*  PLT 300 269 260 241  MCV 86.7 86.4 87.3 88.8  MCH 27.9 28.7 28.5 28.0  MCHC 32.1 33.2 32.7 31.6  RDW 14.2 13.9 14.3 14.4  LYMPHSABS 0.7  --   --   --   MONOABS 0.8  --   --   --   EOSABS 0.0  --   --   --   BASOSABS 0.0  --   --   --     Chemistries   Recent Labs Lab 07/16/16 1357 07/16/16 1755 07/17/16 0646 07/19/16 0434  NA 139 139 141 143  K 3.9 3.8 3.6 3.0*  CL 103 109 108 105  CO2 23 22 24 29   GLUCOSE 148* 199* 113* 86  BUN 15 14 11  <5*  CREATININE 1.08 0.97 1.09 0.88  CALCIUM 9.5 8.4* 8.8* 8.3*  MG  --  2.1  --   --   AST 26 23 22 19   ALT 14* 15* 14* 14*  ALKPHOS 84 72 69 54  BILITOT 1.3* 1.3* 1.5* 0.7   ------------------------------------------------------------------------------------------------------------------ estimated creatinine clearance is 73.7 mL/min (by C-G formula based on SCr of 0.88 mg/dL). ------------------------------------------------------------------------------------------------------------------ No results for input(s): HGBA1C in the last 72 hours. ------------------------------------------------------------------------------------------------------------------ No results for input(s): CHOL, HDL, LDLCALC, TRIG, CHOLHDL, LDLDIRECT in the last 72 hours. ------------------------------------------------------------------------------------------------------------------  Recent Labs  07/16/16 1814  TSH 4.612*    ------------------------------------------------------------------------------------------------------------------  Recent Labs  07/16/16 1814  VITAMINB12 320    Coagulation profile No results for input(s): INR, PROTIME in the last 168 hours.  No results for input(s): DDIMER in the last 72 hours.  Cardiac Enzymes No results for input(s): CKMB, TROPONINI, MYOGLOBIN in the last 168 hours.  Invalid input(s): CK ------------------------------------------------------------------------------------------------------------------ Invalid input(s): POCBNP   CBG:  Recent Labs Lab 07/17/16 1617  GLUCAP 142*       Studies: Dennis Combs Wo Contrast  Result Date: 07/18/2016 CLINICAL DATA:  Dennis Combs  M; generalized seizure. EXAM: MRI HEAD WITHOUT AND WITH CONTRAST TECHNIQUE: Multiplanar, multiecho pulse sequences of the brain and surrounding structures were obtained without and with intravenous contrast. CONTRAST:  46mL MULTIHANCE GADOBENATE DIMEGLUMINE 529 MG/ML IV SOLN COMPARISON:  07/16/2016 CT of the head. FINDINGS: Brain: Moderate motion degradation on multiple pulse sequences. No acute infarction, hemorrhage, hydrocephalus, extra-axial collection or mass lesion. Foci of T2 FLAIR hyperintense signal abnormality in periventricular and subcortical white matter are compatible with moderate chronic microvascular ischemic changes. Moderate brain parenchymal volume loss. No structural abnormality of the brain identified. Normal midline structures including pituitary, complete corpus callosum, and vermis. No cerebellar tonsillar herniation. After administration of intravenous contrast there is no abnormal enhancement of the brain. No definite asymmetry in hippocampal size or signal. Vascular: Normal flow voids. Skull and upper cervical spine: Normal marrow signal. Sinuses/Orbits: Negative. Other: None. IMPRESSION: 1. No acute intracranial abnormality identified. No abnormal enhancement of the brain.  2. Moderate chronic microvascular ischemic changes and moderate parenchymal volume loss of the brain. 3. No structural cause of seizure identified. Electronically Signed   By: Kristine Garbe M.D.   On: 07/18/2016 14:19   Dg Abd Portable 1v  Result Date: 07/18/2016 CLINICAL DATA:  Vomiting EXAM: PORTABLE ABDOMEN - 1 VIEW COMPARISON:  None. FINDINGS: Gas throughout mildly prominent large and small bowel loops. No evidence of bowel obstruction. No organomegaly or free air. No suspicious calcification.  IMPRESSION: Nonspecific bowel gas pattern without evidence of obstruction. No acute findings. Electronically Signed   By: Rolm Baptise M.D.   On: 07/18/2016 08:16      Lab Results  Component Value Date   HGBA1C 5.8 06/30/2012   HGBA1C 5.7 04/09/2011   HGBA1C 5.9 04/13/2009   Lab Results  Component Value Date   LDLCALC 114 (H) 03/25/2012   CREATININE 0.88 07/19/2016       Scheduled Meds: . aspirin  81 mg Oral Daily  . chlorhexidine  15 mL Mouth Rinse BID  . diazepam  1 mg Oral TID  . enoxaparin (LOVENOX) injection  40 mg Subcutaneous Q24H  . folic acid  1 mg Oral Daily  . LORazepam  1 mg Intravenous Once  . LORazepam  1 mg Oral Once  . mouth rinse  15 mL Mouth Rinse q12n4p  . QUEtiapine  50 mg Oral BID  . sodium chloride flush  3 mL Intravenous Q12H  . sodium chloride flush  3 mL Intravenous Q12H  . verapamil  120 mg Oral QHS   Continuous Infusions: . sodium chloride    . sodium chloride 75 mL/hr at 07/19/16 0600  . acyclovir Stopped (07/19/16 0076)  . cefTRIAXone (ROCEPHIN)  IV Stopped (07/18/16 2017)  . levETIRAcetam Stopped (07/18/16 2206)     LOS: 2 days    Time spent: >30 MINS    Reyne Dumas  [facility] Pager [phone]. If 7PM-7AM, please contact night-coverage at [email or web address], password [facility] 07/19/2016, 7:51 AM  LOS: 2 days

## 2016-07-19 NOTE — Care Management Note (Addendum)
Case Management Note  Patient Details  Name: Dennis Combs MRN: [id number] Date of Birth: 01-10-1941  Subjective/Objective:                 Patient with order to DC to home. Spoke to patients wife, would like to use [facility] for [facility].  Action/Plan:  DC to home with HH through [facility] and RW.    Expected Discharge Date:  07/19/16               Expected Discharge Plan:  Home/Self Care  In-House Referral:     Discharge planning Services  CM Consult  Post Acute Care Choice:    Choice offered to:     DME Arranged:    DME Agency:     HH Arranged:    HH Agency:     Status of Service:  Completed, signed off  If discussed at [employer] of Stay Meetings, dates discussed:    Additional Comments:  Carles Collet, RN 07/19/2016, 12:36 PM

## 2016-07-20 LAB — CSF CULTURE W GRAM STAIN: Culture: NO GROWTH

## 2016-07-20 LAB — HEMOGLOBIN A1C
HEMOGLOBIN A1C: 5.6 % (ref 4.8–5.6)
MEAN PLASMA GLUCOSE: 114 mg/dL

## 2016-07-20 LAB — CSF CULTURE: SPECIAL REQUESTS: NORMAL

## 2016-07-24 LAB — CULTURE, BLOOD (ROUTINE X 2)
Culture: NO GROWTH
Culture: NO GROWTH

## 2016-08-04 ENCOUNTER — Ambulatory Visit: Payer: Self-pay | Admitting: Neurology

## 2016-08-04 ENCOUNTER — Telehealth: Payer: Self-pay | Admitting: *Deleted

## 2016-08-04 NOTE — Telephone Encounter (Signed)
Canceled 9:30am new pt appt at 8:48am.  They got lost on the way to the appt.

## 2016-08-05 ENCOUNTER — Encounter: Payer: Self-pay | Admitting: Neurology

## 2016-08-05 ENCOUNTER — Ambulatory Visit (INDEPENDENT_AMBULATORY_CARE_PROVIDER_SITE_OTHER): Payer: Medicare PPO | Admitting: Neurology

## 2016-08-05 VITALS — BP 115/72 | HR 90 | Ht 70.0 in | Wt 151.0 lb

## 2016-08-05 DIAGNOSIS — F0391 Unspecified dementia with behavioral disturbance: Secondary | ICD-10-CM

## 2016-08-05 DIAGNOSIS — F0390 Unspecified dementia without behavioral disturbance: Secondary | ICD-10-CM | POA: Insufficient documentation

## 2016-08-05 DIAGNOSIS — R569 Unspecified convulsions: Secondary | ICD-10-CM | POA: Diagnosis not present

## 2016-08-05 MED ORDER — DIVALPROEX SODIUM 500 MG PO DR TAB: 500.0000 mg | DELAYED_RELEASE_TABLET | Freq: Two times a day (BID) | ORAL | 11 refills | Status: DC

## 2016-08-05 NOTE — Progress Notes (Signed)
PATIENT: Dennis Combs DOB: 02-04-1941  Chief Complaint  Patient presents with  . Seizures    He is here with his wife, Baldo Ash.  Reports developing garbled speech and falling at home on 07/16/16.  He was taken to the ED for evaluation and had a seizure after arriving to the hospital.  He was started on Vimpat 100mg , BID at discharge but has since ran out of the medication.  He has not had any further seizure activity.  Marland Kitchen PCP    Charolette Forward, MD     HISTORICAL  Dennis Combs is a 76 year old right-handed male, accompanied by his wife, seen in refer by primary care physician Dr.  Charolette Forward, to follow-up hospital discharge in April 2018, initial evaluation was on Aug 05 2016.  He had a history of hypertension, bleeding ulcer in the past required gastrotomy, he had 77 years education, retired as Administrator at age 21. He lives at home with wife, has four children. He has chronic insomnia, He denies family history of dementia. SVT taking verapamil, coronary artery disease, history of alcohol and smoke, he was treated with TB in 1962,   He has gradual onset of memory loss since 2011, his wife noticed his personality change, and memory loss, he was evaluated by [facility] neurologist Dr. Kyra Searles, I was able to review most recent evaluation on October 25 2014, per description, he become agitated easily, sometimes aggressive behavior, he had gradual onset gait difficulty, has been falling, he shuffles, He was diagnosed with frontotemporal dementia, he has hallucinations, was put on seroquel 25 mg 2 tablets twice a day, which has helped  CT of the chest without contrast in November 2014, 10 x 6 mm nodular density at the right lung apex, 8x6 mm nodule at the left upper lung,  Infrarenal abdominal aortic aneurysm measure up to 3.2 centimeter,   His gait abnormality gradually gets worse began to use walker since 2018, he fell in the shower, could not get up on April 25th 2018, was  noted to have slurred speech,   Ambulance took him to the emergency room, he had a witnessed seizure at emergency room, he was loaded with Keppra 1 g IV, followed by 500 mg twice a day, but per patient and his family, was discharged home without prescription for antiepileptic medications. Chart reviewed, there was a prescription of Vimpat 100 mg twice a day, patient never get it.  I personally reviewed MRI of the brain with and without contrast on July 18 2016: 1. No acute intracranial abnormality identified.  Moderate chronic microvascular ischemic changes and moderate parenchymal volume loss of the brain.  Chest x-ray showed no acute cardiopulmonary disease Laboratory evaluation: A1c 5.6, normal CBC with exception of mildly decreased hemoglobin 12, normal CMP, with creatinine of 0.88, mildly decreased albumin in 2.8, decreased calcium 8.3, TSH was mildly elevated at 4.6, normal B12 320, CSF, WBC of 9, RBC of 9, it was traumatic tap, tube one showed pink hazy CSF, was RBC of 1645, urinalysis showed ketone 20, protein of 30, UDS was negative  EEG on July 17 2016 showed significant artifact there was no evidence of epileptiform discharges,  REVIEW OF SYSTEMS: Full 14 system review of systems performed and notable only for as above  ALLERGIES: Allergies  Allergen Reactions  . Ivp Dye [Iodinated Diagnostic Agents] Hives, Itching and Other (See Comments)    Mouth itching  . Metronidazole Diarrhea and Nausea And Vomiting  . Codeine Hives, Itching  and Rash  . Percocet [Oxycodone-Acetaminophen] Itching    HOME MEDICATIONS: Current Outpatient Prescriptions  Medication Sig Dispense Refill  . aspirin 81 MG tablet Take 81 mg by mouth daily.    . CHOLECALCIFEROL PO Take 1 tablet by mouth See admin instructions. Takes occasionally    . lacosamide 100 MG TABS Take 1 tablet (100 mg total) by mouth 2 (two) times daily. 60 tablet 2  . QUEtiapine (SEROQUEL) 25 MG tablet Take 50 mg by mouth 2 (two) times  daily.    . verapamil (VERELAN PM) 120 MG 24 hr capsule Take 120 mg by mouth at bedtime.    . potassium chloride SA (K-DUR,KLOR-CON) 20 MEQ tablet Take 2 tablets (40 mEq total) by mouth once. 30 tablet 0   No current facility-administered medications for this visit.     PAST MEDICAL HISTORY: Past Medical History:  Diagnosis Date  . AAA (abdominal aortic aneurysm) ([place]) 2007  . Anal fissure   . Anxiety   . CAD (coronary artery disease)    last catheterization 2004, medical management.  . Cardiac arrhythmia   . COPD (chronic obstructive pulmonary disease) ([place])   . Dementia   . Diverticulosis of colon (without mention of hemorrhage)   . Enlargement of lymph nodes   . Gastroparesis   . GERD (gastroesophageal reflux disease)   . 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 Hemorrhoids   . Hiatal hernia   . History of pneumonia   . Hyperglycemia   . Hyperlipidemia   . Perirectal abscess 02/06/12  . Personal history of colonic polyps    hyperplastic polyps (2004) & Tubular adenoma(2000)  . PUD (peptic ulcer disease)   . Seizure ([place])   . SIRS (systemic inflammatory response syndrome) ([facility])   . SVT (supraventricular tachycardia) ([place])   . Tobacco abuse     PAST SURGICAL HISTORY: Past Surgical History:  Procedure Laterality Date  . ABDOMINAL SURGERY  1999   Vagotomy and pyloroplasty due to bleeding ulcer  . APPENDECTOMY    . TONSILLECTOMY      FAMILY HISTORY: Family History  Problem Relation Age of Onset  . Diabetes Mother   . Colon polyps Mother   . Ulcerative colitis Mother        ??  . Prostate cancer Father     SOCIAL HISTORY:  Social History   Social History  . Marital status: Married    Spouse name: N/A  . Number of children: 4  . Years of education: 1 year college   Occupational History  . retired     Press photographer   Social History Main Topics  . Smoking status: Former Smoker    Packs/day: 0.50    Years: 50.00    Types: Cigarettes    Quit date: 06/27/2015  .  Smokeless tobacco: Never Used     Comment: down from several ppd  . Alcohol use No     Comment: previous alcohol abuse, quit 2 years ago  . Drug use: No  . Sexual activity: Not on file   Other Topics Concern  . Not on file   Social History Narrative   Lives at home with his wife.   Right-handed.   No caffeine use.     PHYSICAL EXAM   Vitals:   08/05/16 1413  BP: 115/72  Pulse: 90  Weight: 151 lb (68.5 kg)  Height: 5\' 10"  (1.778 m)    Not recorded      Body mass index is 21.67 kg/m.  PHYSICAL EXAMNIATION:  Gen: NAD, conversant, well nourised, obese, well groomed                     Cardiovascular: Regular rate rhythm, no peripheral edema, warm, nontender. Eyes: Conjunctivae clear without exudates or hemorrhage Neck: Supple, no carotid bruits. Pulmonary: Clear to auscultation bilaterally   NEUROLOGICAL EXAM:  MENTAL STATUS: Speech:    Speech is normal; fluent and spontaneous with normal comprehension.  Cognition:     Orientation to time, place and person     Normal recent and remote memory     Normal Attention span and concentration     Normal Language, naming, repeating,spontaneous speech     Fund of knowledge   CRANIAL NERVES: CN II: Visual fields are full to confrontation. Fundoscopic exam is normal with sharp discs and no vascular changes. Pupils are round equal and briskly reactive to light. CN III, IV, VI: extraocular movement are normal. No ptosis. CN V: Facial sensation is intact to pinprick in all 3 divisions bilaterally. Corneal responses are intact.  CN VII: Face is symmetric with normal eye closure and smile. CN VIII: Hearing is normal to rubbing fingers CN IX, X: Palate elevates symmetrically. Phonation is normal. CN XI: Head turning and shoulder shrug are intact CN XII: Tongue is midline with normal movements and no atrophy.  MOTOR: There is no pronator drift of out-stretched arms. Muscle bulk and tone are normal. Muscle strength is  normal.  REFLEXES: Reflexes are 2+ and symmetric at the biceps, triceps, knees, and ankles. Plantar responses are flexor.  SENSORY: Intact to light touch, pinprick, positional sensation and vibratory sensation are intact in fingers and toes.  COORDINATION: Rapid alternating movements and fine finger movements are intact. There is no dysmetria on finger-to-nose and heel-knee-shin.    GAIT/STANCE: He needs push up to get up from seated position, cautious rule out his walker Romberg is absent.   DIAGNOSTIC DATA (LABS, IMAGING, TESTING) - I reviewed patient records, labs, notes, testing and imaging myself where available.   ASSESSMENT AND PLAN  Dennis Combs is a 76 y.o. male   Progressive memory loss, personality change, language difficulty  Probable frontotemporal dementia Seizure on July 16 2016  Vimpat has the possibility of worsening mood disorder  Will change to Depakote DR 500 mg twice a day,  Laboratory evaluations   Marcial Pacas, M.D. Ph.D.  [facility] [address] [place], [place] [address] Ph: [phone] Fax: [phone]  CC:Charolette Forward, MD

## 2016-08-06 LAB — THYROID PANEL WITH TSH
Free Thyroxine Index: 2.3 (ref 1.2–4.9)
T3 Uptake Ratio: 30 % (ref 24–39)
T4, Total: 7.7 ug/dL (ref 4.5–12.0)
TSH: 3.78 u[IU]/mL (ref 0.450–4.500)

## 2016-09-18 ENCOUNTER — Telehealth: Payer: Self-pay | Admitting: Neurology

## 2016-09-18 MED ORDER — QUETIAPINE FUMARATE 25 MG PO TABS: 50.0000 mg | ORAL_TABLET | Freq: Two times a day (BID) | ORAL | 1 refills | Status: AC

## 2016-09-18 NOTE — Addendum Note (Signed)
Addended by: Noberto Retort C on: 09/18/2016 03:51 PM   Modules accepted: Orders

## 2016-09-18 NOTE — Telephone Encounter (Signed)
Attempted to return the phone call.  No answer - unable to leave message (voicemail full).

## 2016-09-18 NOTE — Telephone Encounter (Signed)
Ok, per vo by Dr. Leta Baptist, to provide refill of Seroquel 25mg , 2 tablets BID. Patient's wife (on HIPAA) is aware this will be sent to the pharmacy.  They will address his need for a power scooter at his next follow up with Dr. Krista Blue.

## 2016-09-18 NOTE — Telephone Encounter (Signed)
Patients wife called office in reference to QUEtiapine (SEROQUEL) 25 MG tablet 2 times daily.  Patient is out of the medication and last filled was with Dr. Hall Busing.    Wife also would like to know if we are able to help patient get a powered scooter.

## 2016-11-10 ENCOUNTER — Telehealth: Payer: Self-pay | Admitting: *Deleted

## 2016-11-10 ENCOUNTER — Ambulatory Visit: Payer: Medicare PPO | Admitting: Neurology

## 2016-11-10 NOTE — Telephone Encounter (Signed)
No showed follow up appointment. 

## 2016-11-11 ENCOUNTER — Encounter: Payer: Self-pay | Admitting: Neurology

## 2016-12-11 ENCOUNTER — Other Ambulatory Visit: Payer: Self-pay | Admitting: Diagnostic Neuroimaging

## 2017-08-09 ENCOUNTER — Other Ambulatory Visit: Payer: Self-pay | Admitting: Neurology

## 2017-10-03 ENCOUNTER — Other Ambulatory Visit: Payer: Self-pay | Admitting: Neurology

## 2019-06-23 DEATH — deceased
